# Patient Record
Sex: Male | Born: 1990
Health system: Southern US, Community
[De-identification: ages and names within clinical notes are randomized; demographics above are authoritative.]

## PROBLEM LIST (undated history)

## (undated) DIAGNOSIS — F32A Depression, unspecified: Secondary | ICD-10-CM

## (undated) DIAGNOSIS — F319 Bipolar disorder, unspecified: Secondary | ICD-10-CM

## (undated) DIAGNOSIS — F419 Anxiety disorder, unspecified: Secondary | ICD-10-CM

## (undated) DIAGNOSIS — F909 Attention-deficit hyperactivity disorder, unspecified type: Secondary | ICD-10-CM

## (undated) DIAGNOSIS — F329 Major depressive disorder, single episode, unspecified: Secondary | ICD-10-CM

## (undated) DIAGNOSIS — F1910 Other psychoactive substance abuse, uncomplicated: Secondary | ICD-10-CM

## (undated) HISTORY — DX: Depression, unspecified: F32.A

## (undated) HISTORY — DX: Anxiety disorder, unspecified: F41.9

## (undated) HISTORY — DX: Major depressive disorder, single episode, unspecified: F32.9

## (undated) HISTORY — DX: Attention-deficit hyperactivity disorder, unspecified type: F90.9

---
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
## 2012-12-24 ENCOUNTER — Emergency Department (HOSPITAL_BASED_OUTPATIENT_CLINIC_OR_DEPARTMENT_OTHER): Payer: BC Managed Care – PPO

## 2012-12-24 ENCOUNTER — Encounter (HOSPITAL_BASED_OUTPATIENT_CLINIC_OR_DEPARTMENT_OTHER): Payer: Self-pay | Admitting: Emergency Medicine

## 2012-12-24 ENCOUNTER — Emergency Department (HOSPITAL_BASED_OUTPATIENT_CLINIC_OR_DEPARTMENT_OTHER)
Admission: EM | Admit: 2012-12-24 | Discharge: 2012-12-24 | Disposition: A | Discharge status: Home / Self Care | Payer: BC Managed Care – PPO | Attending: Emergency Medicine | Admitting: Emergency Medicine

## 2012-12-24 DIAGNOSIS — F17200 Nicotine dependence, unspecified, uncomplicated: Secondary | ICD-10-CM | POA: Insufficient documentation

## 2012-12-24 DIAGNOSIS — R296 Repeated falls: Secondary | ICD-10-CM | POA: Insufficient documentation

## 2012-12-24 DIAGNOSIS — S20219A Contusion of unspecified front wall of thorax, initial encounter: Secondary | ICD-10-CM | POA: Insufficient documentation

## 2012-12-24 DIAGNOSIS — Y9239 Other specified sports and athletic area as the place of occurrence of the external cause: Secondary | ICD-10-CM | POA: Insufficient documentation

## 2012-12-24 DIAGNOSIS — S20229A Contusion of unspecified back wall of thorax, initial encounter: Secondary | ICD-10-CM

## 2012-12-24 DIAGNOSIS — S90122A Contusion of left lesser toe(s) without damage to nail, initial encounter: Secondary | ICD-10-CM

## 2012-12-24 DIAGNOSIS — S90129A Contusion of unspecified lesser toe(s) without damage to nail, initial encounter: Secondary | ICD-10-CM | POA: Insufficient documentation

## 2012-12-24 DIAGNOSIS — Z8659 Personal history of other mental and behavioral disorders: Secondary | ICD-10-CM | POA: Insufficient documentation

## 2012-12-24 DIAGNOSIS — Y9367 Activity, basketball: Secondary | ICD-10-CM | POA: Insufficient documentation

## 2012-12-24 DIAGNOSIS — IMO0002 Reserved for concepts with insufficient information to code with codable children: Secondary | ICD-10-CM | POA: Insufficient documentation

## 2012-12-24 HISTORY — DX: Bipolar disorder, unspecified: F31.9

## 2012-12-24 MED ORDER — IBUPROFEN 800 MG PO TABS: 800.0000 mg | ORAL_TABLET | Freq: Three times a day (TID) | ORAL | Status: DC

## 2012-12-24 NOTE — ED Notes (Signed)
Left great toe injury while playing basketball yesterday. Pain in his left foot and back.

## 2012-12-24 NOTE — ED Provider Notes (Signed)
CSN: [id number]     Arrival date & time 12/24/12  1730 History   First MD Initiated Contact with Patient 12/24/12 1801     Chief Complaint  Patient presents with  . Toe Injury   (Consider location/radiation/quality/duration/timing/severity/associated sxs/prior Treatment) Patient is a 22 y.o. male presenting with fall. The history is provided by the patient. No language interpreter was used.  Fall This is a new problem. The current episode started yesterday. The problem occurs constantly. The problem has been gradually worsening. Associated symptoms include joint swelling. Associated symptoms comments: Back pain. Nothing aggravates the symptoms. He has tried nothing for the symptoms. The treatment provided moderate relief.    Past Medical History  Diagnosis Date  . Bipolar 1 disorder    History reviewed. No pertinent past surgical history. No family history on file. History  Substance Use Topics  . Smoking status: Current Every Day Smoker -- 1.00 packs/day    Types: Cigarettes  . Smokeless tobacco: Not on file  . Alcohol Use: Yes    Review of Systems  Musculoskeletal: Positive for back pain and joint swelling.  All other systems reviewed and are negative.    Allergies  Review of patient's allergies indicates no known allergies.  Home Medications  No current outpatient prescriptions on file. BP 144/68  Pulse 91  Temp(Src) 98.9 F (37.2 C) (Oral)  Resp 20  Ht 5\' 10"  (1.778 m)  Wt 175 lb (79.379 kg)  BMI 25.11 kg/m2  SpO2 99% Physical Exam  Nursing note and vitals reviewed. Constitutional: He is oriented to person, place, and time. He appears well-developed.  Cardiovascular: Normal rate.   Pulmonary/Chest: Effort normal.  Musculoskeletal: He exhibits tenderness.  Swollen tender left 1st toe,  Tender mid thoracic spine  Neurological: He is alert and oriented to person, place, and time.  Skin: Skin is warm.  Psychiatric: He has a normal mood and affect.    ED  Course  Procedures (including critical care time) Labs Review Labs Reviewed - No data to display Imaging Review No results found.  EKG Interpretation   None       MDM   1. Contusion, toe, left, initial encounter   2. Contusion of back wall of thorax, unspecified laterality, initial encounter    Buddy tape,  Follow up with dr. Pearletha Forge for recheck    Elson Areas, PA-C 12/24/12 1942

## 2012-12-28 NOTE — ED Provider Notes (Signed)
Medical screening examination/treatment/procedure(s) were performed by non-physician practitioner and as supervising physician I was immediately available for consultation/collaboration.    Derran Sear R Kristal Perl, MD 12/28/12 1538 

## 2013-01-22 ENCOUNTER — Ambulatory Visit: Payer: BC Managed Care – PPO | Admitting: Family Medicine

## 2014-04-27 ENCOUNTER — Emergency Department (HOSPITAL_BASED_OUTPATIENT_CLINIC_OR_DEPARTMENT_OTHER)
Admission: EM | Admit: 2014-04-27 | Discharge: 2014-04-27 | Disposition: A | Discharge status: Home / Self Care | Payer: BLUE CROSS/BLUE SHIELD | Attending: Emergency Medicine | Admitting: Emergency Medicine

## 2014-04-27 ENCOUNTER — Encounter (HOSPITAL_BASED_OUTPATIENT_CLINIC_OR_DEPARTMENT_OTHER): Payer: Self-pay | Admitting: *Deleted

## 2014-04-27 DIAGNOSIS — K6289 Other specified diseases of anus and rectum: Secondary | ICD-10-CM | POA: Insufficient documentation

## 2014-04-27 DIAGNOSIS — Z720 Tobacco use: Secondary | ICD-10-CM | POA: Diagnosis not present

## 2014-04-27 DIAGNOSIS — R197 Diarrhea, unspecified: Secondary | ICD-10-CM | POA: Diagnosis not present

## 2014-04-27 DIAGNOSIS — Z8659 Personal history of other mental and behavioral disorders: Secondary | ICD-10-CM | POA: Diagnosis not present

## 2014-04-27 DIAGNOSIS — Z791 Long term (current) use of non-steroidal anti-inflammatories (NSAID): Secondary | ICD-10-CM | POA: Diagnosis not present

## 2014-04-27 DIAGNOSIS — R109 Unspecified abdominal pain: Secondary | ICD-10-CM | POA: Diagnosis present

## 2014-04-27 MED ORDER — HYDROCORTISONE 2.5 % RE CREA: TOPICAL_CREAM | RECTAL | Status: DC

## 2014-04-27 NOTE — Discharge Instructions (Signed)
Anal Fistula An anal fistula is an abnormal tunnel that develops between the bowel and skin near the outside of the anus, where feces comes out. The anus has a number of tiny glands that make lubricating fluid. Sometimes these glands can become plugged and infected. This may lead to the development of a fluid-filled pocket (abscess). An anal fistula often develops after this infection or abscess. It is nearly always caused by a past or current anal abscess.  CAUSES  Though an anal fistula is almost always caused by a past or current anal abscess, other causes can include:  A complication of surgery.  Trauma to the rectal area.  Radiation to the area.  Other medical conditions or diseases, such as:   Chronic inflammatory bowel disease, such as Crohn disease or ulcerative colitis.   Colon or rectal cancer.   Diverticular disease, such as diverticulitis.   A sexually transmitted disease, such as gonorrhea, chlamydia, or syphilis.  An HIV infection or AIDS.  SYMPTOMS   Throbbing or constant pain that may be worse when sitting.   Swelling or irritation around the anus.   Drainage of pus or blood from an opening near the anus.   Pain with bowel movements.  Fever or chills. DIAGNOSIS  Your caregiver will examine the area to find the openings of the anal fistula and the fistula tract. The external opening of the anal fistula may be seen during a physical examination. Other examinations that may be performed include:   Examination of the rectal area with a gloved hand (digital rectal exam).   Examination with a probe or scope to help locate the internal opening of the fistula.   Injection of a dye into the fistula opening. X-rays can be taken to find the exact location and path of the fistula.   An MRI or ultrasound of the anal area.  Other tests may be performed to find the cause of the anal fistula.   TREATMENT  The most common treatment for an anal fistula is  surgery. There are different surgery options depending on where your fistula is located and how complex the fistula is. Surgical options include:  A fistulotomy. This surgery involves opening up the whole fistula and draining the contents inside to promote healing.  Seton placement. A silk string (seton) is placed into the fistula during a fistulotomy to drain any infection to promote healing.  Advancement flap procedure. Tissue is removed from your rectum or the skin around the anus and is attached to the opening of the fistula.  Bioprosthetic plug. A cone-shaped plug is made from your tissue and is used to block the opening of the fistula. Some anal fistulas do not require surgery. A fibrin glue is a non-surgical option that involves injecting the glue to seal the fistula. You also may be prescribed an antibiotic medicine to treat an infection.  HOME CARE INSTRUCTIONS   Take your antibiotics as directed. Finish them even if you start to feel better.  Only take over-the-counter or prescription medicines as directed by your caregiver.Use a stool softener or laxative, if recommended.   Eat a high-fiber diet to help avoid constipation or as directed by your caregiver.  Drink enough water to keep your urine clear or pale yellow.   A warm sitz bath may be soothing and help with healing. You may take warm sitz baths for 15-20 minutes, 3-4 times a day to ease pain and discomfort.   Follow excellent hygiene to keep the anal area  as clean and dry as possible. Use wet toilet paper or moist towelettes after each bowel movement.  SEEK MEDICAL CARE IF: You have increased pain not controlled with medicines.  SEEK IMMEDIATE MEDICAL CARE IF:  You have severe, intolerable pain.  You have new swelling, redness, or discharge around the anal area.  You have tenderness or warmth around the anal area.  You have chills or diarrhea.  You have severe problems urinating or having a bowel movement.    You have a fever or persistent symptoms for more than 2-3 days.   You have a fever and your symptoms suddenly get worse.  MAKE SURE YOU:   Understand these instructions.  Will watch your condition.  Will get help right away if you are not doing well or get worse. Document Released: 02/01/2008 Document Revised: 02/05/2012 Document Reviewed: 12/24/2010 [facility] Patient Information 2015 [place], [place]. This information is not intended to replace advice given to you by your health care provider. Make sure you discuss any questions you have with your health care provider.  Diarrhea Diarrhea is frequent loose and watery bowel movements. It can cause you to feel weak and dehydrated. Dehydration can cause you to become tired and thirsty, have a dry mouth, and have decreased urination that often is dark yellow. Diarrhea is a sign of another problem, most often an infection that will not last long. In most cases, diarrhea typically lasts 2-3 days. However, it can last longer if it is a sign of something more serious. It is important to treat your diarrhea as directed by your caregiver to lessen or prevent future episodes of diarrhea. CAUSES  Some common causes include:  Gastrointestinal infections caused by viruses, bacteria, or parasites.  Food poisoning or food allergies.  Certain medicines, such as antibiotics, chemotherapy, and laxatives.  Artificial sweeteners and fructose.  Digestive disorders. HOME CARE INSTRUCTIONS  Ensure adequate fluid intake (hydration): Have 1 cup (8 oz) of fluid for each diarrhea episode. Avoid fluids that contain simple sugars or sports drinks, fruit juices, whole milk products, and sodas. Your urine should be clear or pale yellow if you are drinking enough fluids. Hydrate with an oral rehydration solution that you can purchase at pharmacies, retail stores, and online. You can prepare an oral rehydration solution at home by mixing the following ingredients  together:   - tsp table salt.   tsp baking soda.   tsp salt substitute containing potassium chloride.  1  tablespoons sugar.  1 L (34 oz) of water.  Certain foods and beverages may increase the speed at which food moves through the gastrointestinal (GI) tract. These foods and beverages should be avoided and include:  Caffeinated and alcoholic beverages.  High-fiber foods, such as raw fruits and vegetables, nuts, seeds, and whole grain breads and cereals.  Foods and beverages sweetened with sugar alcohols, such as xylitol, sorbitol, and mannitol.  Some foods may be well tolerated and may help thicken stool including:  Starchy foods, such as rice, toast, pasta, low-sugar cereal, oatmeal, grits, baked potatoes, crackers, and bagels.  Bananas.  Applesauce.  Add probiotic-rich foods to help increase healthy bacteria in the GI tract, such as yogurt and fermented milk products.  Wash your hands well after each diarrhea episode.  Only take over-the-counter or prescription medicines as directed by your caregiver.  Take a warm bath to relieve any burning or pain from frequent diarrhea episodes. SEEK IMMEDIATE MEDICAL CARE IF:   You are unable to keep fluids down.  You  have persistent vomiting.  You have blood in your stool, or your stools are black and tarry.  You do not urinate in 6-8 hours, or there is only a small amount of very dark urine.  You have abdominal pain that increases or localizes.  You have weakness, dizziness, confusion, or light-headedness.  You have a severe headache.  Your diarrhea gets worse or does not get better.  You have a fever or persistent symptoms for more than 2-3 days.  You have a fever and your symptoms suddenly get worse. MAKE SURE YOU:   Understand these instructions.  Will watch your condition.  Will get help right away if you are not doing well or get worse. Document Released: 02/08/2002 Document Revised: 07/05/2013 Document  Reviewed: 10/27/2011 [facility] Patient Information 2015 [place], [place]. This information is not intended to replace advice given to you by your health care provider. Make sure you discuss any questions you have with your health care provider.

## 2014-04-27 NOTE — ED Notes (Signed)
Patient states that when he "goes to take a crap" theres "burning in his stomach." N/D

## 2014-04-27 NOTE — ED Provider Notes (Signed)
CSN: [id number]     Arrival date & time 04/27/14  1131 History   First MD Initiated Contact with Patient 04/27/14 1220     Chief Complaint  Patient presents with  . Abdominal Pain     Patient is a 24 y.o. male presenting with abdominal pain. The history is provided by the patient. No language interpreter was used.  Abdominal Pain  Clayton Stafford presents for the evaluation of rectal pain. He reports that he had diarrhea since last night. Approximately 3-4 episodes of loose stools. He has pain in his rectum with bowel movements. He thinks or maybe a small amount of blood present. He denies any fevers, vomiting, abdominal pain. He does have some nausea. He denies any history of trauma or injury to the rectum. He has no dysuria. No history of prior abdominal surgeries or medical problems. Symptoms are mild and intermittent.  Past Medical History  Diagnosis Date  . Bipolar 1 disorder    History reviewed. No pertinent past surgical history. No family history on file. History  Substance Use Topics  . Smoking status: Current Every Day Smoker -- 1.00 packs/day    Types: Cigarettes  . Smokeless tobacco: Not on file  . Alcohol Use: Yes    Review of Systems  Gastrointestinal: Positive for abdominal pain.  All other systems reviewed and are negative.     Allergies  Review of patient's allergies indicates no known allergies.  Home Medications   Prior to Admission medications   Medication Sig Start Date End Date Taking? Authorizing Provider  hydrocortisone (ANUSOL-HC) 2.5 % rectal cream Apply rectally 2 times daily 04/27/14   Tilden FossaElizabeth Keryn Nessler, MD  ibuprofen (ADVIL,MOTRIN) 800 MG tablet Take 1 tablet (800 mg total) by mouth 3 (three) times daily. 12/24/12   Lonia SkinnerLeslie K Sofia, PA-C   BP 128/54 mmHg  Pulse 68  Temp(Src) 97.8 F (36.6 C) (Oral)  Resp 16  SpO2 97% Physical Exam  Constitutional: He is oriented to person, place, and time. He appears well-developed and well-nourished.  HENT:   Head: Normocephalic and atraumatic.  Cardiovascular: Normal rate and regular rhythm.   No murmur heard. Pulmonary/Chest: Effort normal and breath sounds normal. No respiratory distress.  Abdominal: Soft. There is no tenderness. There is no rebound and no guarding.  Genitourinary:  Patient refuses rectal exam  Musculoskeletal: He exhibits no edema or tenderness.  Neurological: He is alert and oriented to person, place, and time.  Skin: Skin is warm and dry.  Psychiatric: He has a normal mood and affect. His behavior is normal.  Nursing note and vitals reviewed.   ED Course  Procedures (including critical care time) Labs Review Labs Reviewed - No data to display  Imaging Review No results found.   EKG Interpretation None      MDM   Final diagnoses:  Rectal pain  Diarrhea    Patient here for evaluation of rectal pain and some diarrhea. Patient is hemodynamically stable and nontoxic appearing on exam. History and presentation is not consistent with diverticulitis, appendicitis. Discussed with patient's recommendation for rectal exam to evaluate for fissures or hemorrhoids or evidence of infection. Patient refuses rectal exam. Discussed with patient that this is not a thorough evaluation in diagnoses can be missed. Patient understands risks and requests a local cream be prescribed. Providing prescription for Anusol cream with close PCP follow-up and return precautions were discussed.    Tilden FossaElizabeth Toma Erichsen, MD 04/27/14 1326

## 2014-05-15 ENCOUNTER — Encounter (HOSPITAL_BASED_OUTPATIENT_CLINIC_OR_DEPARTMENT_OTHER): Payer: Self-pay | Admitting: *Deleted

## 2014-05-15 ENCOUNTER — Emergency Department (HOSPITAL_BASED_OUTPATIENT_CLINIC_OR_DEPARTMENT_OTHER)
Admission: EM | Admit: 2014-05-15 | Discharge: 2014-05-15 | Discharge status: Against Medical Advice | Payer: BLUE CROSS/BLUE SHIELD | Attending: Emergency Medicine | Admitting: Emergency Medicine

## 2014-05-15 DIAGNOSIS — Z7952 Long term (current) use of systemic steroids: Secondary | ICD-10-CM | POA: Diagnosis not present

## 2014-05-15 DIAGNOSIS — Z8659 Personal history of other mental and behavioral disorders: Secondary | ICD-10-CM | POA: Diagnosis not present

## 2014-05-15 DIAGNOSIS — Z791 Long term (current) use of non-steroidal anti-inflammatories (NSAID): Secondary | ICD-10-CM | POA: Insufficient documentation

## 2014-05-15 DIAGNOSIS — Z720 Tobacco use: Secondary | ICD-10-CM | POA: Diagnosis not present

## 2014-05-15 DIAGNOSIS — R197 Diarrhea, unspecified: Secondary | ICD-10-CM

## 2014-05-15 DIAGNOSIS — R112 Nausea with vomiting, unspecified: Secondary | ICD-10-CM | POA: Insufficient documentation

## 2014-05-15 DIAGNOSIS — R1084 Generalized abdominal pain: Secondary | ICD-10-CM | POA: Diagnosis not present

## 2014-05-15 MED ORDER — ONDANSETRON 8 MG PO TBDP
8.0000 mg | ORAL_TABLET | Freq: Once | ORAL | Status: AC
  Administered 2014-05-15: 8 mg via ORAL
  Filled 2014-05-15: qty 1

## 2014-05-15 MED ORDER — KETOROLAC TROMETHAMINE 30 MG/ML IJ SOLN: 30.0000 mg | Freq: Once | INTRAMUSCULAR | Status: DC

## 2014-05-15 MED ORDER — SODIUM CHLORIDE 0.9 % IV BOLUS (SEPSIS)
1000.0000 mL | Freq: Once | INTRAVENOUS | Status: DC
Start: 2014-05-15 — End: 2014-05-15

## 2014-05-15 MED ORDER — ONDANSETRON HCL 4 MG/2ML IJ SOLN: 4.0000 mg | Freq: Once | INTRAMUSCULAR | Status: DC

## 2014-05-15 NOTE — ED Notes (Signed)
Pt refused all blood work tests and IV administration of any medications.  This RN explained reasons for testing and potential complications, and patient verbally acknowledged risks of refusing medical testing and treatment.  MD and PA-C advised.

## 2014-05-15 NOTE — ED Notes (Signed)
Pt reports vomited 10-12 times yesterday- states abd pain today after attempting to eat breakfast- states still feels weak

## 2014-05-16 NOTE — ED Provider Notes (Signed)
CSN: [id number]     Arrival date & time 05/15/14  1437 History   First MD Initiated Contact with Patient 05/15/14 1521     Chief Complaint  Patient presents with  . Emesis     (Consider location/radiation/quality/duration/timing/severity/associated sxs/prior Treatment) HPI Clayton Stafford is a 24 y.o. male with history of bipolar disorder presents to emergency department complaining of nausea, vomiting, abdominal pain, diarrhea. Patient states he vomited over 20 times yesterday. States no emesis today. She states he did have a diarrhea today, still having abdominal pain, but states it is improving as well. He did not try any medications prior to coming in. He states he feels weak and dehydrated. He denies any fever or chills. No blood in his emesis or use stools. He denies any recent travel, no recent ill contacts. No recent antibiotic use. Nothing is making his symptoms better or worse.  Past Medical History  Diagnosis Date  . Bipolar 1 disorder    History reviewed. No pertinent past surgical history. No family history on file. History  Substance Use Topics  . Smoking status: Current Every Day Smoker -- 1.00 packs/day    Types: Cigarettes  . Smokeless tobacco: Never Used  . Alcohol Use: 1.2 oz/week    2 Cans of beer per week     Comment: weekends    Review of Systems  Constitutional: Negative for fever and chills.  Respiratory: Negative for cough, chest tightness and shortness of breath.   Cardiovascular: Negative for chest pain, palpitations and leg swelling.  Gastrointestinal: Positive for nausea, vomiting, abdominal pain and diarrhea. Negative for abdominal distention.  Genitourinary: Negative for dysuria, urgency, frequency and hematuria.  Musculoskeletal: Negative for myalgias, arthralgias, neck pain and neck stiffness.  Skin: Negative for rash.  Allergic/Immunologic: Negative for immunocompromised state.  Neurological: Negative for dizziness, weakness,  light-headedness, numbness and headaches.  All other systems reviewed and are negative.     Allergies  Review of patient's allergies indicates no known allergies.  Home Medications   Prior to Admission medications   Medication Sig Start Date End Date Taking? Authorizing Provider  hydrocortisone (ANUSOL-HC) 2.5 % rectal cream Apply rectally 2 times daily 04/27/14   Clayton FossaElizabeth Rees, MD  ibuprofen (ADVIL,MOTRIN) 800 MG tablet Take 1 tablet (800 mg total) by mouth 3 (three) times daily. 12/24/12   Clayton AreasLeslie K Sofia, PA-C   BP 121/69 mmHg  Pulse 82  Temp(Src) 98.3 F (36.8 C) (Oral)  Resp 18  SpO2 100% Physical Exam  Constitutional: He is oriented to person, place, and time. He appears well-developed and well-nourished. No distress.  HENT:  Head: Normocephalic and atraumatic.  Mouth/Throat: Oropharynx is clear and moist.  Eyes: Conjunctivae are normal.  Neck: Neck supple.  Cardiovascular: Normal rate, regular rhythm and normal heart sounds.   Pulmonary/Chest: Effort normal. No respiratory distress. He has no wheezes. He has no rales.  Abdominal: Soft. Bowel sounds are normal. He exhibits no distension. There is tenderness. There is no rebound.  Diffuse tenderness  Musculoskeletal: He exhibits no edema.  Neurological: He is alert and oriented to person, place, and time.  Skin: Skin is warm and dry.  Nursing note and vitals reviewed.   ED Course  Procedures (including critical care time) Labs Review Labs Reviewed - No data to display  Imaging Review No results found.   EKG Interpretation None      MDM   Final diagnoses:  Nausea vomiting and diarrhea   patient is here with nausea, vomiting, diarrhea, abdominal  pain. All symptoms are improving. His abdomen is benign. I did order blood work on him and some medications for his symptoms, however patient refused. He will be discharged home AMA. Patient admitted all he needed was a work note. I did give him Zofran 8 mg ODT prior  to him leaving.  Filed Vitals:   05/15/14 1451  BP: 121/69  Pulse: 82  Temp: 98.3 F (36.8 C)  TempSrc: Oral  Resp: 18  SpO2: 100%     Clayton Crumble, PA-C 05/16/14 0115  Clayton Scott, MD 05/17/14 [phone]

## 2014-05-27 ENCOUNTER — Encounter (HOSPITAL_BASED_OUTPATIENT_CLINIC_OR_DEPARTMENT_OTHER): Payer: Self-pay | Admitting: *Deleted

## 2014-05-27 ENCOUNTER — Emergency Department (HOSPITAL_BASED_OUTPATIENT_CLINIC_OR_DEPARTMENT_OTHER)
Admission: EM | Admit: 2014-05-27 | Discharge: 2014-05-27 | Disposition: A | Discharge status: Home / Self Care | Payer: BLUE CROSS/BLUE SHIELD | Attending: Emergency Medicine | Admitting: Emergency Medicine

## 2014-05-27 DIAGNOSIS — Z7952 Long term (current) use of systemic steroids: Secondary | ICD-10-CM | POA: Insufficient documentation

## 2014-05-27 DIAGNOSIS — Z8659 Personal history of other mental and behavioral disorders: Secondary | ICD-10-CM | POA: Insufficient documentation

## 2014-05-27 DIAGNOSIS — K2970 Gastritis, unspecified, without bleeding: Secondary | ICD-10-CM | POA: Insufficient documentation

## 2014-05-27 DIAGNOSIS — Z720 Tobacco use: Secondary | ICD-10-CM | POA: Diagnosis not present

## 2014-05-27 DIAGNOSIS — Z791 Long term (current) use of non-steroidal anti-inflammatories (NSAID): Secondary | ICD-10-CM | POA: Diagnosis not present

## 2014-05-27 DIAGNOSIS — R112 Nausea with vomiting, unspecified: Secondary | ICD-10-CM | POA: Diagnosis present

## 2014-05-27 MED ORDER — SODIUM CHLORIDE 0.9 % IV BOLUS (SEPSIS)
1000.0000 mL | Freq: Once | INTRAVENOUS | Status: AC
  Administered 2014-05-27: 1000 mL via INTRAVENOUS

## 2014-05-27 MED ORDER — ONDANSETRON HCL 4 MG/2ML IJ SOLN
4.0000 mg | Freq: Once | INTRAMUSCULAR | Status: AC
  Administered 2014-05-27: 4 mg via INTRAVENOUS
  Filled 2014-05-27: qty 2

## 2014-05-27 NOTE — ED Provider Notes (Signed)
CSN: [id number]     Arrival date & time 05/27/14  1023 History   First MD Initiated Contact with Patient 05/27/14 1036     Chief Complaint  Patient presents with  . Emesis     (Consider location/radiation/quality/duration/timing/severity/associated sxs/prior Treatment) HPI Comments: Patient presents with nausea vomiting. He states that he drank a lot of alcohol last night and woke up this morning with nausea and vomiting. He states with the last couple episodes of vomiting, he had a few specks of blood in it. He has no ongoing bleeding. He says his abdomen feels sore from the vomiting but he denies any abdominal pain. He denies any diarrhea. He states he recently stop using heroin about 4-5 days ago. He denies any other withdrawal type symptoms. There is no myalgias. He denies any fevers.  Patient is a 24 y.o. male presenting with vomiting.  Emesis Associated symptoms: no abdominal pain, no arthralgias, no chills, no diarrhea and no headaches     Past Medical History  Diagnosis Date  . Bipolar 1 disorder    History reviewed. No pertinent past surgical history. No family history on file. History  Substance Use Topics  . Smoking status: Current Every Day Smoker -- 1.00 packs/day    Types: Cigarettes  . Smokeless tobacco: Never Used  . Alcohol Use: 1.2 oz/week    2 Cans of beer per week     Comment: weekends    Review of Systems  Constitutional: Negative for fever, chills, diaphoresis and fatigue.  HENT: Negative for congestion, rhinorrhea and sneezing.   Eyes: Negative.   Respiratory: Negative for cough, chest tightness and shortness of breath.   Cardiovascular: Negative for chest pain and leg swelling.  Gastrointestinal: Positive for nausea and vomiting. Negative for abdominal pain, diarrhea and blood in stool.  Genitourinary: Negative for frequency, hematuria, flank pain and difficulty urinating.  Musculoskeletal: Negative for back pain and arthralgias.  Skin: Negative for  rash.  Neurological: Negative for dizziness, speech difficulty, weakness, numbness and headaches.      Allergies  Review of patient's allergies indicates no known allergies.  Home Medications   Prior to Admission medications   Medication Sig Start Date End Date Taking? Authorizing Provider  hydrocortisone (ANUSOL-HC) 2.5 % rectal cream Apply rectally 2 times daily 04/27/14   Tilden Fossa, MD  ibuprofen (ADVIL,MOTRIN) 800 MG tablet Take 1 tablet (800 mg total) by mouth 3 (three) times daily. 12/24/12   Lonia Skinner Sofia, PA-C   BP 100/44 mmHg  Pulse 68  Temp(Src) 98 F (36.7 C) (Oral)  Resp 18  SpO2 98% Physical Exam  Constitutional: He is oriented to person, place, and time. He appears well-developed and well-nourished.  HENT:  Head: Normocephalic and atraumatic.  Eyes: Pupils are equal, round, and reactive to light.  Neck: Normal range of motion. Neck supple.  Cardiovascular: Normal rate, regular rhythm and normal heart sounds.   Pulmonary/Chest: Effort normal and breath sounds normal. No respiratory distress. He has no wheezes. He has no rales. He exhibits no tenderness.  Abdominal: Soft. Bowel sounds are normal. There is no tenderness. There is no rebound and no guarding.  Musculoskeletal: Normal range of motion. He exhibits no edema.  Lymphadenopathy:    He has no cervical adenopathy.  Neurological: He is alert and oriented to person, place, and time.  Skin: Skin is warm and dry. No rash noted.  Psychiatric: He has a normal mood and affect.    ED Course  Procedures (including critical care time)  Labs Review Labs Reviewed - No data to display  Imaging Review No results found.   EKG Interpretation None      MDM   Final diagnoses:  Gastritis    Patient presents with vomiting after drinking a large amount alcohol. His abdominal exam is completely benign. He was given a liter IV fluids and some Zofran in the ED. And currently he is pacing the room ready to  leave. He was discharged home and given return precautions.    Rolan BuccoMelanie Adlai Nieblas, MD 05/27/14 [phone]

## 2014-05-27 NOTE — ED Notes (Signed)
Pt also reports he "drank a lot last night" and stopped doing heroin 4 days ago

## 2014-05-27 NOTE — ED Notes (Signed)
Pt states "I feel like I have hangover.Marland Kitchen.Marland Kitchen."

## 2014-05-27 NOTE — ED Notes (Signed)
Reports n/v since 0900 this morning; vomited x 6

## 2014-05-27 NOTE — Discharge Instructions (Signed)

## 2014-07-19 ENCOUNTER — Encounter (HOSPITAL_COMMUNITY): Payer: Self-pay | Admitting: *Deleted

## 2014-07-19 ENCOUNTER — Emergency Department (HOSPITAL_COMMUNITY)
Admission: EM | Admit: 2014-07-19 | Discharge: 2014-07-19 | Disposition: A | Discharge status: Home / Self Care | Payer: BLUE CROSS/BLUE SHIELD | Attending: Emergency Medicine | Admitting: Emergency Medicine

## 2014-07-19 DIAGNOSIS — Z791 Long term (current) use of non-steroidal anti-inflammatories (NSAID): Secondary | ICD-10-CM | POA: Insufficient documentation

## 2014-07-19 DIAGNOSIS — Z720 Tobacco use: Secondary | ICD-10-CM | POA: Diagnosis not present

## 2014-07-19 DIAGNOSIS — F10120 Alcohol abuse with intoxication, uncomplicated: Secondary | ICD-10-CM | POA: Insufficient documentation

## 2014-07-19 DIAGNOSIS — Z7952 Long term (current) use of systemic steroids: Secondary | ICD-10-CM | POA: Insufficient documentation

## 2014-07-19 DIAGNOSIS — F10129 Alcohol abuse with intoxication, unspecified: Secondary | ICD-10-CM | POA: Diagnosis present

## 2014-07-19 DIAGNOSIS — F10920 Alcohol use, unspecified with intoxication, uncomplicated: Secondary | ICD-10-CM

## 2014-07-19 LAB — CBC WITH DIFFERENTIAL/PLATELET
Basophils Absolute: 0.1 10*3/uL (ref 0.0–0.1)
Basophils Relative: 1 % (ref 0–1)
EOS ABS: 0.2 10*3/uL (ref 0.0–0.7)
EOS PCT: 3 % (ref 0–5)
HCT: 43.7 % (ref 39.0–52.0)
HEMOGLOBIN: 14.8 g/dL (ref 13.0–17.0)
LYMPHS ABS: 3.2 10*3/uL (ref 0.7–4.0)
Lymphocytes Relative: 42 % (ref 12–46)
MCH: 29.5 pg (ref 26.0–34.0)
MCHC: 33.9 g/dL (ref 30.0–36.0)
MCV: 87.2 fL (ref 78.0–100.0)
MONOS PCT: 7 % (ref 3–12)
Monocytes Absolute: 0.5 10*3/uL (ref 0.1–1.0)
Neutro Abs: 3.6 10*3/uL (ref 1.7–7.7)
Neutrophils Relative %: 47 % (ref 43–77)
Platelets: 321 10*3/uL (ref 150–400)
RBC: 5.01 MIL/uL (ref 4.22–5.81)
RDW: 12.6 % (ref 11.5–15.5)
WBC: 7.5 10*3/uL (ref 4.0–10.5)

## 2014-07-19 LAB — BASIC METABOLIC PANEL
ANION GAP: 13 (ref 5–15)
BUN: 8 mg/dL (ref 6–20)
CALCIUM: 8.9 mg/dL (ref 8.9–10.3)
CHLORIDE: 103 mmol/L (ref 101–111)
CO2: 25 mmol/L (ref 22–32)
Creatinine, Ser: 0.92 mg/dL (ref 0.61–1.24)
GFR calc Af Amer: >60 mL/min (ref >60)
GFR calc non Af Amer: >60 mL/min (ref >60)
Glucose, Bld: 94 mg/dL (ref 65–99)
POTASSIUM: 2.9 mmol/L — AB (ref 3.5–5.1)
SODIUM: 141 mmol/L (ref 135–145)

## 2014-07-19 LAB — ETHANOL: ALCOHOL ETHYL (B): 162 mg/dL — AB (ref <5)

## 2014-07-19 NOTE — ED Notes (Signed)
Patient is alert and oriented x3.  He was given DC instructions and follow up visit instructions.  Patient gave verbal understanding.  He was DC ambulatory under his own power to home.  V/S stable.  He was not showing any signs of distress on DC 
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
## 2014-07-19 NOTE — Discharge Instructions (Signed)
Behavioral Health Resources in the Community ° °Intensive Outpatient Programs: °[facility]      °[address] °[place], [place] °[phone] °Both a day and evening program °      °[facility]     °[address]        °[place], [place] [address] °[phone]        ° °[facility] °[address] °[place] °[phone] ° °[facility] °ACCESS LINE: 1-800-853-5163 or 336-641-4981 °[address] °[place], [place] [address] °[email or web address].[email or web address]/adult.htm ° °Mobile Crisis Teams:         °                               °[facility]         °Mobile Crisis Care Unit °1-877-626-1772       °      °Assertive °[facility] °[address] [place] °[phone] °                                        °[facility] °Sharon DeEsch °[address] °[place] °336-554-5454 ° °Self-Help/Support Groups: °[facility] Variety of support groups °373-1402 (call for more info) ° °Narcotics Anonymous (NA) °[facility] °[address] °[place] - 2 meetings at this location ° °Residential Treatment Programs:  °[facility]      °[address]        °[place]       °866-801-8205        ° °[facility] °[address] °[place], [place]  [address] °[phone] ° °[facility]  °[address] °[place], [place] [address] °[phone] °Admissions: 8am-3pm M-F ° °[facility]     °[address]        °[place], [place] [address]       °[phone]        ° °[facility] °[address] °[place], [place] °[phone] ° °[facility] °[address] °[place], [place] °336-285-9073 ° °[facility] Intensive [facility]      °[address]     °[place], [place]       °[phone]        ° °[facility]        °[address] °[place], [place] °877-615-2722 or 336-784-9470 ° °[facility]  °[address] °[place], [place] °[phone] ° °[facility]                                               °[address] °[place]  °800-659-3381 ° °[facility]: °[facility]- 1-888-581-9988              ° °General Therapy                                                °Julie Brannon, PhD        °  [address]                                       [place], [place] [address]         [phone]   Insurance  [facility]   [address] [place], [place] [address] [phone]  [facility] [address] [place], [place] [address] [phone] Insurance/Medicaid/sponsorship through [facility]                                              [address]                                        [place], [place] [address]    Therapy/tele-psych/case         [phone]          [facility] [address]   [place], [place]  [address]  Adolescent/group home/case management [phone]                                           [place]ola CornJulia Brannon PhD       General therapy       Insurance   [phone]         Dr. Lolly MustacheArfeen Insurance [phone]- [phone] M-F  [facility] Medicaid, sponsorship [phone]   Alcohol Intoxication Alcohol intoxication occurs when you drink enough alcohol that it affects your ability to function. It can be mild or very severe. Drinking a lot of alcohol in a short time is called binge drinking. This can be very harmful. Drinking alcohol can also be more dangerous if you are taking medicines or other drugs. Some of the effects caused by alcohol may include:  Loss of coordination.  Changes in mood and behavior.  Unclear thinking.  Trouble talking (slurred speech).  Throwing up (vomiting).  Confusion.  Slowed breathing.  Twitching and shaking (seizures).  Loss  of consciousness. HOME CARE  Do not drive after drinking alcohol.  Drink enough water and fluids to keep your pee (urine) clear or pale yellow. Avoid caffeine.  Only take medicine as told by your doctor. GET HELP IF:  You throw up (vomit) many times.  You do not feel better after a few days.  You frequently have alcohol intoxication. Your doctor can help decide if you should see a substance use treatment counselor. GET HELP RIGHT AWAY IF:  You become shaky when you stop drinking.  You have twitching and shaking.  You throw up blood. It may look bright red or like coffee grounds.  You notice blood in your poop (bowel movements).  You become lightheaded or pass out (faint). MAKE SURE YOU:   Understand these instructions.  Will watch your condition.  Will get help right away if you are not doing well or get worse. Document Released: 08/07/2007 Document Revised: 10/21/2012 Document Reviewed: 07/24/2012 [facility] Patient Information 2015 [place], [place]. This information is not intended to replace advice given to you by your health care provider. Make sure you discuss any questions you have with  your health care provider.  How Much is Too Much Alcohol? Drinking too much alcohol can cause injury, accidents, and health problems. These types of problems can include:   Car crashes.  Falls.  Family fighting (domestic violence).  Drowning.  Fights.  Injuries.  Burns.  Damage to certain organs.  Having a baby with birth defects. ONE DRINK CAN BE TOO MUCH WHEN YOU ARE:  Working.  Pregnant or breastfeeding.  Taking medicines. Ask your doctor.  Driving or planning to drive. WHAT IS A STANDARD DRINK?   1 regular beer (12 ounces or 360 milliliters).  1 glass of wine (5 ounces or 150 milliliters).  1 shot of liquor (1.5 ounces or 45 milliliters). BLOOD ALCOHOL LEVELS   .00 A person is sober.  Marland Kitchen.03 A person has no trouble keeping balance, talking, or seeing  right, but a "buzz" may be felt.  Marland Kitchen.05 A person feels "buzzed" and relaxed.  Marland Kitchen.08 or .10  A person is drunk. He or she has trouble talking, seeing right, and keeping his or her balance.  .15 A person loses body control and may pass out (blackout).  .20 A person has trouble walking (staggering) and throws up (vomits).  .30 A person will pass out (unconscious).  .40+ A person will be in a coma. Death is possible. If you or someone you know has a drinking problem, get help from a doctor.  Document Released: 12/15/2008 Document Revised: 05/13/2011 Document Reviewed: 12/15/2008 [facility] Patient Information 2015 [place], [place]. This information is not intended to replace advice given to you by your health care provider. Make sure you discuss any questions you have with your health care provider.
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
## 2014-09-05 ENCOUNTER — Ambulatory Visit (HOSPITAL_COMMUNITY)
Admission: RE | Admit: 2014-09-05 | Discharge: 2014-09-05 | Disposition: A | Discharge status: Home / Self Care | Payer: BLUE CROSS/BLUE SHIELD | Source: Home / Self Care | Attending: Psychiatry | Admitting: Psychiatry

## 2014-09-05 ENCOUNTER — Encounter (HOSPITAL_COMMUNITY): Payer: Self-pay | Admitting: *Deleted

## 2014-09-05 ENCOUNTER — Encounter (HOSPITAL_COMMUNITY): Payer: Self-pay

## 2014-09-05 ENCOUNTER — Inpatient Hospital Stay (HOSPITAL_COMMUNITY)
Admission: EM | Admit: 2014-09-05 | Discharge: 2014-09-07 | DRG: 897 | Disposition: A | Discharge status: Home / Self Care | Payer: BLUE CROSS/BLUE SHIELD | Source: Intra-hospital | Attending: Psychiatry | Admitting: Psychiatry

## 2014-09-05 ENCOUNTER — Encounter (HOSPITAL_COMMUNITY): Payer: Self-pay | Admitting: Emergency Medicine

## 2014-09-05 ENCOUNTER — Emergency Department (HOSPITAL_COMMUNITY)
Admission: EM | Admit: 2014-09-05 | Discharge: 2014-09-05 | Disposition: A | Discharge status: Other Acute Inpatient Hospital | Payer: BLUE CROSS/BLUE SHIELD | Attending: Emergency Medicine | Admitting: Emergency Medicine

## 2014-09-05 DIAGNOSIS — F1994 Other psychoactive substance use, unspecified with psychoactive substance-induced mood disorder: Secondary | ICD-10-CM | POA: Diagnosis not present

## 2014-09-05 DIAGNOSIS — F17210 Nicotine dependence, cigarettes, uncomplicated: Secondary | ICD-10-CM | POA: Diagnosis present

## 2014-09-05 DIAGNOSIS — F319 Bipolar disorder, unspecified: Secondary | ICD-10-CM | POA: Diagnosis present

## 2014-09-05 DIAGNOSIS — F419 Anxiety disorder, unspecified: Secondary | ICD-10-CM | POA: Diagnosis present

## 2014-09-05 DIAGNOSIS — Z7952 Long term (current) use of systemic steroids: Secondary | ICD-10-CM | POA: Insufficient documentation

## 2014-09-05 DIAGNOSIS — Z720 Tobacco use: Secondary | ICD-10-CM | POA: Diagnosis not present

## 2014-09-05 DIAGNOSIS — F1210 Cannabis abuse, uncomplicated: Secondary | ICD-10-CM | POA: Diagnosis not present

## 2014-09-05 DIAGNOSIS — R45851 Suicidal ideations: Secondary | ICD-10-CM

## 2014-09-05 DIAGNOSIS — F1924 Other psychoactive substance dependence with psychoactive substance-induced mood disorder: Principal | ICD-10-CM | POA: Diagnosis present

## 2014-09-05 DIAGNOSIS — F1910 Other psychoactive substance abuse, uncomplicated: Secondary | ICD-10-CM

## 2014-09-05 DIAGNOSIS — Z791 Long term (current) use of non-steroidal anti-inflammatories (NSAID): Secondary | ICD-10-CM | POA: Insufficient documentation

## 2014-09-05 DIAGNOSIS — F1920 Other psychoactive substance dependence, uncomplicated: Secondary | ICD-10-CM | POA: Diagnosis present

## 2014-09-05 DIAGNOSIS — G4700 Insomnia, unspecified: Secondary | ICD-10-CM | POA: Diagnosis present

## 2014-09-05 DIAGNOSIS — F4310 Post-traumatic stress disorder, unspecified: Secondary | ICD-10-CM | POA: Diagnosis present

## 2014-09-05 DIAGNOSIS — F1410 Cocaine abuse, uncomplicated: Secondary | ICD-10-CM | POA: Diagnosis not present

## 2014-09-05 LAB — COMPREHENSIVE METABOLIC PANEL
ALT: 12 U/L — AB (ref 17–63)
AST: 14 U/L — ABNORMAL LOW (ref 15–41)
Albumin: 5 g/dL (ref 3.5–5.0)
Alkaline Phosphatase: 88 U/L (ref 38–126)
Anion gap: 10 (ref 5–15)
BUN: 8 mg/dL (ref 6–20)
CO2: 28 mmol/L (ref 22–32)
CREATININE: 0.89 mg/dL (ref 0.61–1.24)
Calcium: 9.6 mg/dL (ref 8.9–10.3)
Chloride: 101 mmol/L (ref 101–111)
GFR calc non Af Amer: >60 mL/min (ref >60)
Glucose, Bld: 88 mg/dL (ref 65–99)
Potassium: 3.7 mmol/L (ref 3.5–5.1)
Sodium: 139 mmol/L (ref 135–145)
Total Bilirubin: 0.8 mg/dL (ref 0.3–1.2)
Total Protein: 8.4 g/dL — ABNORMAL HIGH (ref 6.5–8.1)

## 2014-09-05 LAB — CBC
HEMATOCRIT: 46.2 % (ref 39.0–52.0)
Hemoglobin: 15.8 g/dL (ref 13.0–17.0)
MCH: 30.3 pg (ref 26.0–34.0)
MCHC: 34.2 g/dL (ref 30.0–36.0)
MCV: 88.5 fL (ref 78.0–100.0)
Platelets: 304 10*3/uL (ref 150–400)
RBC: 5.22 MIL/uL (ref 4.22–5.81)
RDW: 13.3 % (ref 11.5–15.5)
WBC: 9.2 10*3/uL (ref 4.0–10.5)

## 2014-09-05 LAB — ACETAMINOPHEN LEVEL: Acetaminophen (Tylenol), Serum: <10 ug/mL — ABNORMAL LOW (ref 10–30)

## 2014-09-05 LAB — RAPID URINE DRUG SCREEN, HOSP PERFORMED
Amphetamines: NOT DETECTED
Barbiturates: NOT DETECTED
Benzodiazepines: NOT DETECTED
COCAINE: POSITIVE — AB
Opiates: NOT DETECTED
Tetrahydrocannabinol: POSITIVE — AB

## 2014-09-05 LAB — ETHANOL: ALCOHOL ETHYL (B): 13 mg/dL — AB (ref <5)

## 2014-09-05 LAB — SALICYLATE LEVEL: Salicylate Lvl: <4 mg/dL (ref 2.8–30.0)

## 2014-09-05 MED ORDER — CHLORDIAZEPOXIDE HCL 25 MG PO CAPS
25.0000 mg | ORAL_CAPSULE | Freq: Four times a day (QID) | ORAL | Status: AC
  Administered 2014-09-05 (×4): 25 mg via ORAL
  Filled 2014-09-05 (×4): qty 1

## 2014-09-05 MED ORDER — ENSURE ENLIVE PO LIQD
237.0000 mL | Freq: Two times a day (BID) | ORAL | Status: DC
  Administered 2014-09-06 – 2014-09-07 (×2): 237 mL via ORAL

## 2014-09-05 MED ORDER — HYDROXYZINE HCL 25 MG PO TABS: 25.0000 mg | ORAL_TABLET | Freq: Four times a day (QID) | ORAL | Status: DC | PRN

## 2014-09-05 MED ORDER — CHLORDIAZEPOXIDE HCL 25 MG PO CAPS
25.0000 mg | ORAL_CAPSULE | ORAL | Status: DC
  Administered 2014-09-07: 25 mg via ORAL
  Filled 2014-09-05: qty 1

## 2014-09-05 MED ORDER — NICOTINE 21 MG/24HR TD PT24
21.0000 mg | MEDICATED_PATCH | Freq: Every day | TRANSDERMAL | Status: DC
  Administered 2014-09-06 – 2014-09-07 (×2): 21 mg via TRANSDERMAL
  Filled 2014-09-05 (×4): qty 1
  Filled 2014-09-05: qty 14
  Filled 2014-09-05: qty 1

## 2014-09-05 MED ORDER — CHLORDIAZEPOXIDE HCL 25 MG PO CAPS: 25.0000 mg | ORAL_CAPSULE | Freq: Every day | ORAL | Status: DC

## 2014-09-05 MED ORDER — MAGNESIUM HYDROXIDE 400 MG/5ML PO SUSP: 30.0000 mL | Freq: Every day | ORAL | Status: DC | PRN

## 2014-09-05 MED ORDER — ONDANSETRON 4 MG PO TBDP: 4.0000 mg | ORAL_TABLET | Freq: Four times a day (QID) | ORAL | Status: DC | PRN

## 2014-09-05 MED ORDER — CHLORDIAZEPOXIDE HCL 25 MG PO CAPS
25.0000 mg | ORAL_CAPSULE | Freq: Three times a day (TID) | ORAL | Status: AC
  Administered 2014-09-06 (×3): 25 mg via ORAL
  Filled 2014-09-05 (×3): qty 1

## 2014-09-05 MED ORDER — ALUM & MAG HYDROXIDE-SIMETH 200-200-20 MG/5ML PO SUSP
30.0000 mL | ORAL | Status: DC | PRN
Start: 2014-09-05 — End: 2014-09-07

## 2014-09-05 MED ORDER — ACETAMINOPHEN 325 MG PO TABS: 650.0000 mg | ORAL_TABLET | Freq: Four times a day (QID) | ORAL | Status: DC | PRN

## 2014-09-05 MED ORDER — CHLORDIAZEPOXIDE HCL 25 MG PO CAPS: 25.0000 mg | ORAL_CAPSULE | Freq: Four times a day (QID) | ORAL | Status: DC | PRN

## 2014-09-05 MED ORDER — THIAMINE HCL 100 MG/ML IJ SOLN
100.0000 mg | Freq: Once | INTRAMUSCULAR | Status: DC
  Filled 2014-09-05: qty 2

## 2014-09-05 MED ORDER — TRAZODONE HCL 50 MG PO TABS
50.0000 mg | ORAL_TABLET | Freq: Every evening | ORAL | Status: DC | PRN
  Administered 2014-09-05 – 2014-09-06 (×2): 50 mg via ORAL
  Filled 2014-09-05 (×2): qty 1
  Filled 2014-09-05 (×2): qty 28
  Filled 2014-09-05 (×4): qty 1

## 2014-09-05 MED ORDER — ADULT MULTIVITAMIN W/MINERALS CH
1.0000 | ORAL_TABLET | Freq: Every day | ORAL | Status: DC
  Administered 2014-09-05 – 2014-09-07 (×3): 1 via ORAL
  Filled 2014-09-05 (×6): qty 1

## 2014-09-05 MED ORDER — LOPERAMIDE HCL 2 MG PO CAPS: 2.0000 mg | ORAL_CAPSULE | ORAL | Status: DC | PRN

## 2014-09-05 MED ORDER — VITAMIN B-1 100 MG PO TABS
100.0000 mg | ORAL_TABLET | Freq: Every day | ORAL | Status: DC
  Administered 2014-09-06 – 2014-09-07 (×2): 100 mg via ORAL
  Filled 2014-09-05 (×4): qty 1

## 2014-09-05 NOTE — ED Provider Notes (Signed)
CSN: [id number]     Arrival date & time 09/05/14  0417 History   First MD Initiated Contact with Patient 09/05/14 [phone]     Chief Complaint  Patient presents with  . Suicidal     (Consider location/radiation/quality/duration/timing/severity/associated sxs/prior Treatment) Patient is a 24 y.o. male presenting with drug problem.  Drug Problem This is a chronic problem. The current episode started more than 1 week ago. The problem occurs constantly. The problem has not changed since onset.Pertinent negatives include no chest pain, no headaches and no shortness of breath. Nothing aggravates the symptoms. Nothing relieves the symptoms. He has tried nothing for the symptoms. The treatment provided no relief.  I also suicidal with a plan to OD or jump in front of cars.  Sent from [facility] for clearance.    Past Medical History  Diagnosis Date  . Bipolar 1 disorder    History reviewed. No pertinent past surgical history. History reviewed. No pertinent family history. History  Substance Use Topics  . Smoking status: Current Every Day Smoker -- 1.00 packs/day    Types: Cigarettes  . Smokeless tobacco: Never Used  . Alcohol Use: 1.2 oz/week    2 Cans of beer per week     Comment: weekends    Review of Systems  Constitutional: Negative for fever.  Respiratory: Negative for shortness of breath.   Cardiovascular: Negative for chest pain, palpitations and leg swelling.  Neurological: Negative for headaches.  All other systems reviewed and are negative.     Allergies  Review of patient's allergies indicates no known allergies.  Home Medications   Prior to Admission medications   Medication Sig Start Date End Date Taking? Authorizing Provider  hydrocortisone (ANUSOL-HC) 2.5 % rectal cream Apply rectally 2 times daily Patient not taking: Reported on 09/05/2014 04/27/14   Tilden Fossa, MD  ibuprofen (ADVIL,MOTRIN) 800 MG tablet Take 1 tablet (800 mg total) by mouth 3 (three) times  daily. Patient not taking: Reported on 09/05/2014 12/24/12   Elson Areas, PA-C   BP 124/91 mmHg  Pulse 76  Temp(Src) 98 F (36.7 C) (Oral)  Resp 18  Ht  (1.778 m)  Wt 160 lb (72.576 kg)  BMI 22.96 kg/m2  SpO2 98% Physical Exam  Constitutional: He is oriented to person, place, and time. He appears well-developed and well-nourished. No distress.  HENT:  Head: Normocephalic and atraumatic.  Mouth/Throat: Oropharynx is clear and moist.  Eyes: Conjunctivae and EOM are normal. Pupils are equal, round, and reactive to light.  Neck: Normal range of motion. Neck supple.  Cardiovascular: Normal rate and regular rhythm.   Pulmonary/Chest: Effort normal and breath sounds normal. No respiratory distress. He has no wheezes. He has no rales.  Abdominal: Soft. Bowel sounds are normal. He exhibits no distension and no mass. There is no tenderness. There is no rebound and no guarding.  Musculoskeletal: Normal range of motion.  Neurological: He is oriented to person, place, and time.  Skin: Skin is warm and dry.  Psychiatric: His speech is not rapid and/or pressured. He expresses suicidal ideation. He expresses suicidal plans.    ED Course  Procedures (including critical care time) Labs Review Labs Reviewed  ACETAMINOPHEN LEVEL - Abnormal; Notable for the following:    Acetaminophen (Tylenol), Serum <10 (*)    All other components within normal limits  COMPREHENSIVE METABOLIC PANEL - Abnormal; Notable for the following:    Total Protein 8.4 (*)    AST 14 (*)    ALT 12 (*)  All other components within normal limits  ETHANOL - Abnormal; Notable for the following:    Alcohol, Ethyl (B) 13 (*)    All other components within normal limits  URINE RAPID DRUG SCREEN, HOSP PERFORMED - Abnormal; Notable for the following:    Cocaine POSITIVE (*)    Tetrahydrocannabinol POSITIVE (*)    All other components within normal limits  CBC  SALICYLATE LEVEL    Imaging Review No results  found.   EKG Interpretation None      MDM   Final diagnoses:  Suicidal ideation    [facility]  Will transfer back for inpatient management    Hogan Hoobler, MD 09/05/14 [phone]

## 2014-09-05 NOTE — [facility] Group Notes (Signed)
[facility] LCSW Aftercare Discharge Planning Group Note  09/05/2014  1:15 PM  Participation Quality: Did Not Attend. Patient invited to participate but declined.  Yahira Timberman, MSW, LCSWA Clinical Social Worker [facility] [phone]   
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
## 2014-09-05 NOTE — Tx Team (Signed)
Initial Interdisciplinary Treatment Plan   PATIENT STRESSORS: Substance abuse   PATIENT STRENGTHS: Average or above average intelligence General fund of knowledge Motivation for treatment/growth Supportive family/friends   PROBLEM LIST: Problem List/Patient Goals Date to be addressed Date deferred Reason deferred Estimated date of resolution  Polysubstance abuse 09/05/2014     depression 09/05/2014     anxiety 09/05/2014     Suicidal ideation 09/05/2014     "I want to recover from drugs and mentally" 09/05/2014                              DISCHARGE CRITERIA:  Improved stabilization in mood, thinking, and/or behavior Withdrawal symptoms are absent or subacute and managed without 24-hour nursing intervention  PRELIMINARY DISCHARGE PLAN: Attend PHP/IOP Attend 12-step recovery group Return to previous living arrangement  PATIENT/FAMIILY INVOLVEMENT: This treatment plan has been presented to and reviewed with the patient, Clayton Stafford he patient and family have been given the opportunity to ask questions and make suggestions.  [name], Clayton Stafford 09/05/2014, 6:34 AM

## 2014-09-05 NOTE — Progress Notes (Signed)
NSG shift assessment. 7a-7p.   D: Pt has spent the morning in bed with his eyes closed. He arouses when his name is called, but is slow to respond. He is alert and oriented x-4.  His slow response seems to be because he is not feeling well. He took his morning medications and was able to sit up to swallow them. Stayed in his room for lunch and continued sleeping after lunch. He did complete a Patient Self Inventory and his goal is to get clean and get on right medications.  Near dinner time he said that he was feeling a little better, but continued to stay in bed.   A:  Support and encouragement offered. Safety maintained with observations every 15 minutes.   R:  No complaints voiced.

## 2014-09-05 NOTE — Progress Notes (Addendum)
Patient on that hallway using phone at the beginning of the shift. His mood and affects flat and depressed. He denied SI/HI and denied Hallucinations. Patient reported that he just got to the unit this afternoon and that he is hungry. Writer offered patient a plate of food. He reported that the only withdrawal symptoms he has is "sweating". Writer encouraged and supported patient. Q 15 minute check continues as ordered to maintain safety.  Patient on the phone upset and cursing out. Writer asked patient who he was talking to and why he was upset. Patient said he was upset because his mother refused to pay for his girl friend to go for [place] in [place]. He said his girl friend's parents has no money and he wants his mother to pay for both of them to go to treatment in [place]. Patient seemed clueless and entitled.

## 2014-09-05 NOTE — ED Notes (Signed)
[facility], pt does have a bed available to him if clear. Pt states he feels worthless, endorses SI with plan to jump in front of a car. Pt states he is using crack, acid, weed, xanax and etoh,  would like help quitting these substances.

## 2014-09-05 NOTE — ED Notes (Signed)
[facility] here to transport pt to [facility] 

## 2014-09-05 NOTE — [facility] Suicide Risk Assessment (Signed)
[facility]   Nursing information obtained from:  Patient Demographic factors:  Male, Caucasian, Unemployed Current Mental Status:  Suicidal ideation indicated by patient Loss Factors:  NA Historical Factors:  Family history of mental illness or substance abuse Risk Reduction Factors:  Living with another person, especially a relative, Responsible for children under 23 years of age Total Time spent with patient: 45 minutes Principal Problem: <principal problem not specified> Diagnosis:   Patient Active Problem List   Diagnosis Date Noted  . Substance induced mood disorder [F19.94] 09/05/2014     Continued Clinical Symptoms:  Alcohol Use Disorder Identification Test Final Score (AUDIT): 36 The "Alcohol Use Disorders Identification Test", Guidelines for Use in Primary Care, Second Edition.  World [employer] writerHealth Organization [facility](WHO). Score between 0-7:  no or low risk or alcohol related problems. Score between 8-15:  moderate risk of alcohol related problems. Score between 16-19:  high risk of alcohol related problems. Score 20 or above:  warrants further diagnostic evaluation for alcohol dependence and treatment.   CLINICAL FACTORS:   Depression:   Comorbid alcohol abuse/dependence Impulsivity Alcohol/Substance Abuse/Dependencies  Psychiatric Specialty Exam: Physical Exam  ROS  Blood pressure 108/73, pulse 106, temperature 98.6 F (37 C), temperature source Oral, resp. rate 18, height 5\' 9"  (1.753 m), weight 71.668 kg (158 lb).Body mass index is 23.32 kg/(m^2).   COGNITIVE FEATURES THAT CONTRIBUTE TO RISK:  Closed-mindedness, Loss of executive function and Thought constriction (tunnel vision)    SUICIDE RISK:   Moderate:  Frequent suicidal ideation with limited intensity, and duration, some specificity in terms of plans, no associated intent, good self-control, limited dysphoria/symptomatology, some risk factors present, and identifiable protective factors,  including available and accessible social support.  PLAN OF CARE: Supportive approach/coping skills                               Polysubstance dependence; detox as necessary                                Mood instability; reassess for mood stabilizers                               Relapse prevention plan                               CBT/minfulness  Medical Decision Making:  Review of Psycho-Social Stressors (1), Review or order clinical lab tests (1), Review of Medication Regimen & Side Effects (2) and Review of New Medication or Change in Dosage (2)  I certify that inpatient services furnished can reasonably be expected to improve the patient's condition.   Randie Bloodgood A 09/05/2014, 9:49 PM

## 2014-09-05 NOTE — ED Notes (Signed)
Bed: [facility] Expected date:  Expected time:  Means of arrival:  Comments: 

## 2014-09-05 NOTE — Progress Notes (Signed)
Patient attended AA Speaker group tonight.  
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
## 2014-09-06 NOTE — Progress Notes (Signed)
D:  Per pt self inventory pt reports sleeping good, appetite good, energy level normal, ability to pay attention good, rates depression at a 3 out of 10, hopelessness at a 1 out of 10, anxiety at a 0 out of 10, denies SI/HI/AVH, pt states that he is feeling much better today and that his withdrawal symptoms are much improved, goal today:  "stay clean, get prepared for discharge tomorrow", denies SI/HI/AVH     A:  Emotional support provided, Encouraged pt to continue with treatment plan and attend all group activities, q15 min checks maintained for safety.  R:  Pt is going to some groups, otherwise he has been sleeping much of the day in his bed, pt states that [place], pleasant with staff and other patients on the unit, interacts appropriately.

## 2014-09-06 NOTE — Progress Notes (Signed)
Recreation Therapy Notes  Animal-Assisted Activity (AAA) Program Checklist/Progress Notes Patient Eligibility Criteria Checklist & Daily Group note for Rec Tx Intervention  Date: 07.05.16 Time: 230 pm Location: 400 Hall Dayroom   AAA/T Program Assumption of Risk Form signed by Patient/ or Parent Legal Guardian yes  Patient is free of allergies or sever asthma yes  Patient reports no fear of animals yes  Patient reports no history of cruelty to animalsyes  Patient understands his/her participation is voluntary yes  Patient washes hands before animal contact yes  Patient washes hands after animal contact yes  Education: Hand Washing, Appropriate Animal Interaction   Education Outcome: Acknowledges understanding/In group clarification offered/Needs additional education.   Clinical Observations/Feedback: Did not attend group.   Clayton Stafford, LRT/CTRS         Clayton Stafford A 09/06/2014 4:41 PM 

## 2014-09-06 NOTE — Progress Notes (Signed)
[facility] Group Notes:  (Nursing/MHT/Case Management/Adjunct)  Date:  09/06/2014  Time:  11:43 PM  Type of Therapy:  Psychoeducational Skills  Participation Level:  Active  Participation Quality:  Appropriate  Affect:  Appropriate  Cognitive:  Appropriate  Insight:  Appropriate  Engagement in Group:  Engaged  Modes of Intervention:  Discussion  Summary of Progress/Problems: Tonight in wrap up group Clayton Stafford said that today was a pretty good day (9 or 10) for him he feels like he has opened up more since he has come here and he feels like he is ready [place]   Clayton Stafford 09/06/2014, 11:43 PM
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
## 2014-09-06 NOTE — Progress Notes (Signed)
NUTRITION ASSESSMENT  Pt identified as at risk on the Malnutrition Screen Tool  INTERVENTION: 1. Educated patient on the importance of nutrition and encouraged intake of food and beverages. 2. Discussed weight goals. 3. Supplements: continue Ensure Enlive BID, each supplement provides 350 kcal and 20 grams of protein  NUTRITION DIAGNOSIS: Unintentional weight loss related to sub-optimal intake as evidenced by pt report.   Goal: Pt to meet >/= 90% of their estimated nutrition needs.  Monitor:  PO intake  Assessment:  Pt seen for MST. Unsure of total weight loss recently. Pt with extensive polysubstance abuse hx and was using up until day of admission. Pt's appetite has been improving since admission and he stated hunger last night.   Ensure Enlive ordered BID. Hesitant to order additional supplements at this time as pt may be at risk for refeeding due to heavy drug/alcohol use PTA.  24 y.o. male  Height: Ht Readings from Last 1 Encounters:  09/05/14 5\' 9"  (1.753 m)    Weight: Wt Readings from Last 1 Encounters:  09/05/14 158 lb (71.668 kg)    Weight Hx: Wt Readings from Last 10 Encounters:  09/05/14 158 lb (71.668 kg)  09/05/14 160 lb (72.576 kg)  12/24/12 175 lb (79.379 kg)    BMI:  Body mass index is 23.32 kg/(m^2). Pt meets criteria for normal weight status based on current BMI.  Estimated Nutritional Needs: Kcal: 25-30 kcal/kg Protein: > 1 gram protein/kg Fluid: 1 ml/kcal  Diet Order: Diet regular Room service appropriate?: Yes; Fluid consistency:: Thin Pt is also offered choice of unit snacks mid-morning and mid-afternoon.  Pt is eating as desired.   Lab results and medications reviewed.     Trenton GammonJessica Aviel Davalos, RD, LDN Inpatient Clinical Dietitian Pager # [phone] After hours/weekend pager # [phone]

## 2014-09-06 NOTE — [facility] Group Notes (Signed)
[facility] 09/06/2014 1:15pm  Type of Therapy: [facility] Presentation  Pt came in to group late, stayed for a short time and left.  Chad CordialLauren Carter, LCSWA 09/06/2014 1:34 PM

## 2014-09-06 NOTE — [facility] Group Notes (Signed)
Adult Psychoeducational Group Note  Date:  09/06/2014 Time:  0850am  Group Topic/Focus:  Goals Group:   The focus of this group is to help patients establish daily goals to achieve during treatment and discuss how the patient can incorporate goal setting into their daily lives to aide in recovery. Orientation:   The focus of this group is to educate the patient on the purpose and policies of crisis stabilization and provide a format to answer questions about their admission.  The group details unit policies and expectations of patients while admitted.  Participation Level:  Did Not Attend  Clayton Stafford 09/06/2014, 2:43 PM

## 2014-09-06 NOTE — Progress Notes (Signed)
[facility] MD Progress Note  09/06/2014 7:34 PM Clayton Stafford  MRN:  [id number] Subjective:  Clayton Stafford endorses that he wants to pursue further treatment by going to a half way house in [place]. States that he wants to get out of [facility]. States that irrespective of what happens with his GF, he wants to pursue this course of action. He expresses a lot of regrets for his past behavior the people he has hurt in the process. States he is really serious this time around Principal Problem: Substance induced mood disorder Diagnosis:   Patient Active Problem List   Diagnosis Date Noted  . Substance induced mood disorder [F19.94] 09/05/2014  . Polysubstance dependence [F19.20] 09/05/2014   Total Time spent with patient: 30 minutes   Past Medical History:  Past Medical History  Diagnosis Date  . Bipolar 1 disorder    History reviewed. No pertinent past surgical history. Family History: History reviewed. No pertinent family history. Social History:  History  Alcohol Use  . 1.2 oz/week  . 2 Cans of beer per week    Comment: weekends     History  Drug Use  . Yes  . Special: IV, "Crack" cocaine, Cocaine, Heroin, Methamphetamines, Marijuana, LSD    Comment: heroin    History   Social History  . Marital Status: Single    Spouse Name: N/A  . Number of Children: N/A  . Years of Education: N/A   Social History Main Topics  . Smoking status: Current Every Day Smoker -- 2.00 packs/day    Types: Cigarettes  . Smokeless tobacco: Never Used  . Alcohol Use: 1.2 oz/week    2 Cans of beer per week     Comment: weekends  . Drug Use: Yes    Special: IV, "Crack" cocaine, Cocaine, Heroin, Methamphetamines, Marijuana, LSD     Comment: heroin  . Sexual Activity: Not on file   Other Topics Concern  . None   Social History Narrative   Additional History:    Sleep: Fair  Appetite:  Fair   Assessment:   Musculoskeletal: Strength & Muscle Tone: within normal limits Gait  & Station: normal Patient leans: normal   Psychiatric Specialty Exam: Physical Exam  Review of Systems  Constitutional: Negative.   HENT: Negative.   Eyes: Negative.   Respiratory: Negative.   Cardiovascular: Negative.   Gastrointestinal: Negative.   Genitourinary: Negative.   Musculoskeletal: Negative.   Skin: Negative.   Neurological: Negative.   Endo/Heme/Allergies: Negative.   Psychiatric/Behavioral: Positive for depression and substance abuse. The patient is nervous/anxious.     Blood pressure 103/87, pulse 126, temperature 97.6 F (36.4 C), temperature source Oral, resp. rate 18, height 5' 9"  (1.753 m), weight 71.668 kg (158 lb).Body mass index is 23.32 kg/(m^2).  General Appearance: Fairly Groomed  Engineer, water::  Fair  Speech:  Clear and Coherent  Volume:  Normal  Mood:  Anxious and worried  Affect:  anxious worried  Thought Process:  Coherent and Goal Directed  Orientation:  Full (Time, Place, and Person)  Thought Content:  symptoms events worries concerns  Suicidal Thoughts:  No  Homicidal Thoughts:  No  Memory:  Immediate;   Fair Recent;   Fair Remote;   Fair  Judgement:  Fair  Insight:  Present  Psychomotor Activity:  Restlessness  Concentration:  Fair  Recall:  [employer] [place]  Language: Fair  Akathisia:  No  Handed:  Right  AIMS (if indicated):     Assets:  Desire for Improvement  ADL's:  Intact  Cognition: WNL  Sleep:        Current Medications: Current Facility-Administered Medications  Medication Dose Route Frequency Provider Last Rate Last Dose  . acetaminophen (TYLENOL) tablet 650 mg  650 mg Oral Q6H PRN Laverle Hobby, PA-C      . alum & mag hydroxide-simeth (MAALOX/MYLANTA) 200-200-20 MG/5ML suspension 30 mL  30 mL Oral Q4H PRN Laverle Hobby, PA-C      . chlordiazePOXIDE (LIBRIUM) capsule 25 mg  25 mg Oral Q6H PRN Laverle Hobby, PA-C      . [START ON 09/07/2014] chlordiazePOXIDE (LIBRIUM) capsule 25 mg  25 mg Oral BH-qamhs  Spencer E Simon, PA-C       Followed by  . [START ON 09/08/2014] chlordiazePOXIDE (LIBRIUM) capsule 25 mg  25 mg Oral Daily Laverle Hobby, PA-C      . feeding supplement (ENSURE ENLIVE) (ENSURE ENLIVE) liquid 237 mL  237 mL Oral BID BM Nicholaus Bloom, MD   237 mL at 09/06/14 1028  . hydrOXYzine (ATARAX/VISTARIL) tablet 25 mg  25 mg Oral Q6H PRN Laverle Hobby, PA-C      . loperamide (IMODIUM) capsule 2-4 mg  2-4 mg Oral PRN Laverle Hobby, PA-C      . magnesium hydroxide (MILK OF MAGNESIA) suspension 30 mL  30 mL Oral Daily PRN Laverle Hobby, PA-C      . multivitamin with minerals tablet 1 tablet  1 tablet Oral Daily Laverle Hobby, PA-C   1 tablet at 09/06/14 0808  . nicotine (NICODERM CQ - dosed in mg/24 hours) patch 21 mg  21 mg Transdermal Daily Nicholaus Bloom, MD   21 mg at 09/06/14 0808  . ondansetron (ZOFRAN-ODT) disintegrating tablet 4 mg  4 mg Oral Q6H PRN Laverle Hobby, PA-C      . thiamine (B-1) injection 100 mg  100 mg Intramuscular Once Laverle Hobby, PA-C   100 mg at 09/05/14 1242  . thiamine (VITAMIN B-1) tablet 100 mg  100 mg Oral Daily Laverle Hobby, PA-C   100 mg at 09/06/14 6629  . traZODone (DESYREL) tablet 50 mg  50 mg Oral QHS,MR X 1 Laverle Hobby, PA-C   50 mg at 09/05/14 2202    Lab Results:  Results for orders placed or performed during the hospital encounter of 09/05/14 (from the past 48 hour(s))  Urine rapid drug screen (hosp performed)not at [facility]     Status: Abnormal   Collection Time: 09/05/14  4:39 AM  Result Value Ref Range   Opiates NONE DETECTED NONE DETECTED   Cocaine POSITIVE (A) NONE DETECTED   Benzodiazepines NONE DETECTED NONE DETECTED   Amphetamines NONE DETECTED NONE DETECTED   Tetrahydrocannabinol POSITIVE (A) NONE DETECTED   Barbiturates NONE DETECTED NONE DETECTED    Comment:        DRUG SCREEN FOR MEDICAL PURPOSES ONLY.  IF CONFIRMATION IS NEEDED FOR ANY PURPOSE, NOTIFY LAB WITHIN 5 DAYS.        LOWEST DETECTABLE LIMITS FOR URINE  DRUG SCREEN Drug Class       Cutoff (ng/mL) Amphetamine      1000 Barbiturate      200 Benzodiazepine   476 Tricyclics       546 Opiates          300 Cocaine          300 THC  50   Acetaminophen level     Status: Abnormal   Collection Time: 09/05/14  4:45 AM  Result Value Ref Range   Acetaminophen (Tylenol), Serum <10 (L) 10 - 30 ug/mL    Comment:        THERAPEUTIC CONCENTRATIONS VARY SIGNIFICANTLY. A RANGE OF 10-30 ug/mL MAY BE AN EFFECTIVE CONCENTRATION FOR MANY PATIENTS. HOWEVER, SOME ARE BEST TREATED AT CONCENTRATIONS OUTSIDE THIS RANGE. ACETAMINOPHEN CONCENTRATIONS >150 ug/mL AT 4 HOURS AFTER INGESTION AND >50 ug/mL AT 12 HOURS AFTER INGESTION ARE OFTEN ASSOCIATED WITH TOXIC REACTIONS.   CBC     Status: None   Collection Time: 09/05/14  4:45 AM  Result Value Ref Range   WBC 9.2 4.0 - 10.5 K/uL   RBC 5.22 4.22 - 5.81 MIL/uL   Hemoglobin 15.8 13.0 - 17.0 g/dL   HCT 46.2 39.0 - 52.0 %   MCV 88.5 78.0 - 100.0 fL   MCH 30.3 26.0 - 34.0 pg   MCHC 34.2 30.0 - 36.0 g/dL   RDW 13.3 11.5 - 15.5 %   Platelets 304 150 - 400 K/uL  Comprehensive metabolic panel     Status: Abnormal   Collection Time: 09/05/14  4:45 AM  Result Value Ref Range   Sodium 139 135 - 145 mmol/L   Potassium 3.7 3.5 - 5.1 mmol/L   Chloride 101 101 - 111 mmol/L   CO2 28 22 - 32 mmol/L   Glucose, Bld 88 65 - 99 mg/dL   BUN 8 6 - 20 mg/dL   Creatinine, Ser 0.89 0.61 - 1.24 mg/dL   Calcium 9.6 8.9 - 10.3 mg/dL   Total Protein 8.4 (H) 6.5 - 8.1 g/dL   Albumin 5.0 3.5 - 5.0 g/dL   AST 14 (L) 15 - 41 U/L   ALT 12 (L) 17 - 63 U/L   Alkaline Phosphatase 88 38 - 126 U/L   Total Bilirubin 0.8 0.3 - 1.2 mg/dL   GFR calc non Af Amer >60 >60 mL/min   GFR calc Af Amer >60 >60 mL/min    Comment: (NOTE) The eGFR has been calculated using the CKD EPI equation. This calculation has not been validated in all clinical situations. eGFR's persistently <60 mL/min signify possible Chronic  Kidney Disease.    Anion gap 10 5 - 15  Ethanol (ETOH)     Status: Abnormal   Collection Time: 09/05/14  4:45 AM  Result Value Ref Range   Alcohol, Ethyl (B) 13 (H) <5 mg/dL    Comment:        LOWEST DETECTABLE LIMIT FOR SERUM ALCOHOL IS 5 mg/dL FOR MEDICAL PURPOSES ONLY   Salicylate level     Status: None   Collection Time: 09/05/14  4:45 AM  Result Value Ref Range   Salicylate Lvl <6.8 2.8 - 30.0 mg/dL    Physical Findings: AIMS: Facial and Oral Movements Muscles of Facial Expression: None, normal Lips and Perioral Area: None, normal Jaw: None, normal Tongue: None, normal,Extremity Movements Upper (arms, wrists, hands, fingers): None, normal Lower (legs, knees, ankles, toes): None, normal, Trunk Movements Neck, shoulders, hips: None, normal, Overall Severity Severity of abnormal movements (highest score from questions above): None, normal Incapacitation due to abnormal movements: None, normal Patient's awareness of abnormal movements (rate only patient's report): No Awareness, Dental Status Current problems with teeth and/or dentures?: No Does patient usually wear dentures?: No  CIWA:  CIWA-Ar Total: 0 COWS:     Treatment Plan Summary: Daily contact with patient to assess and  evaluate symptoms and progress in treatment and Medication management Supportive approach/coping skills Polysubstance Dependence; continue to detox as needed/work a relapse prevention plan Mood instability; continue to reassess for the use of psychotropics CBT/MINfulness Facilitate getting to that program  Medical Decision Making:  Review of Psycho-Social Stressors (1), Review of Medication Regimen & Side Effects (2) and Review of New Medication or Change in Dosage (2)     Izetta Sakamoto A 09/06/2014, 7:34 PM

## 2014-09-06 NOTE — Progress Notes (Signed)
D: Clayton Stafford denies SI/HI/AVH. He says he is looking forward to discharge, which he says is Wednesday at 1300. He has been appropriate on the unit.  A: Meds given as ordered. Q15 safety checks maintained. Support/encouragement offered. R: Pt remains free from harm and continues with treatment. Will continue to monitor for needs/safety.

## 2014-09-07 MED ORDER — CHLORDIAZEPOXIDE HCL 25 MG PO CAPS: 25.0000 mg | ORAL_CAPSULE | ORAL | Status: DC

## 2014-09-07 MED ORDER — TRAZODONE HCL 50 MG PO TABS: 50.0000 mg | ORAL_TABLET | Freq: Every evening | ORAL | Status: DC | PRN

## 2014-09-07 MED ORDER — CHLORDIAZEPOXIDE HCL 25 MG PO CAPS: 25.0000 mg | ORAL_CAPSULE | Freq: Four times a day (QID) | ORAL | Status: DC | PRN

## 2014-09-07 MED ORDER — NICOTINE 21 MG/24HR TD PT24: 21.0000 mg | MEDICATED_PATCH | Freq: Every day | TRANSDERMAL | Status: DC

## 2014-09-07 NOTE — [facility] Suicide Risk Assessment (Signed)
[facility] INPATIENT:  Family/Significant Other Suicide Prevention Education  Suicide Prevention Education:  Education Completed; Lew DawesMichelle Schirtzinger, Pt's mother, has been identified by the patient as the family member/significant other with whom the patient will be residing, and identified as the person(s) who will aid the patient in the event of a mental health crisis (suicidal ideations/suicide attempt).  With written consent from the patient, the family member/significant other has been provided the following suicide prevention education, prior to the and/or following the discharge of the patient.  The suicide prevention education provided includes the following:  Suicide risk factors  Suicide prevention and interventions  National Suicide Hotline telephone number  [facility] assessment telephone number  [facility] 911  [facility] and/or Residential [facility] Crisis Unit telephone number  Request made of family/significant other to:  Remove weapons (e.g., guns, rifles, knives), all items previously/currently identified as safety concern.    Remove drugs/medications (over-the-counter, prescriptions, illicit drugs), all items previously/currently identified as a safety concern.  The family member/significant other verbalizes understanding of the suicide prevention education information provided.  The family member/significant other agrees to remove the items of safety concern listed above.  Elaina Hoopsarter, Alana [place] M 09/07/2014, 1:29 PM

## 2014-09-07 NOTE — Discharge Summary (Signed)
Physician Discharge Summary Note  Patient:  Clayton Stafford is an 24 y.o., male MRN:  [id number] DOB:  [birth year] Patient phone:  [phone] (home)  Patient address:   [address]  [place] [facility] [place] [id number],  Total Time spent with patient: 45 minutes  Date of Admission:  09/05/2014 Date of Discharge: 09/07/2014  Reason for Admission:  Substance   Principal Problem: Substance induced mood disorder Discharge Diagnoses: Patient Active Problem List   Diagnosis Date Noted  . Substance induced mood disorder [F19.94] 09/05/2014  . Polysubstance dependence [F19.20] 09/05/2014    Musculoskeletal: Strength & Muscle Tone: within normal limits Gait & Station: normal Patient leans: N/A  Psychiatric Specialty Exam:  SEE SRA Physical Exam  Vitals reviewed.   Review of Systems  All other systems reviewed and are negative.   Blood pressure 115/66, pulse 79, temperature 97.9 F (36.6 C), temperature source Oral, resp. rate 16, height 5' 9"  (1.753 m), weight 71.668 kg (158 lb).Body mass index is 23.32 kg/(m^2).  Have you used any form of tobacco in the last 30 days? (Cigarettes, Smokeless Tobacco, Cigars, and/or Pipes): Yes  Has this patient used any form of tobacco in the last 30 days? (Cigarettes, Smokeless Tobacco, Cigars, and/or Pipes) N/A  Past Medical History:  Past Medical History  Diagnosis Date  . Bipolar 1 disorder    History reviewed. No pertinent past surgical history. Family History: History reviewed. No pertinent family history. Social History:  History  Alcohol Use  . 1.2 oz/week  . 2 Cans of beer per week    Comment: weekends     History  Drug Use  . Yes  . Special: IV, "Crack" cocaine, Cocaine, Heroin, Methamphetamines, Marijuana, LSD    Comment: heroin    History   Social History  . Marital Status: Single    Spouse Name: N/A  . Number of Children: N/A  . Years of Education: N/A   Social History Main Topics  . Smoking status: Current Every Day  Smoker -- 2.00 packs/day    Types: Cigarettes  . Smokeless tobacco: Never Used  . Alcohol Use: 1.2 oz/week    2 Cans of beer per week     Comment: weekends  . Drug Use: Yes    Special: IV, "Crack" cocaine, Cocaine, Heroin, Methamphetamines, Marijuana, LSD     Comment: heroin  . Sexual Activity: Not on file   Other Topics Concern  . None   Social History Narrative   Risk to Self: Is patient at risk for suicide?: Yes What has been your use of drugs/alcohol within the last 12 months?: 2 gr cocaine/day,  Risk to Others:   Prior Inpatient Therapy:   Prior Outpatient Therapy:    Level of Care:  OP  Hospital Course:  KEVAN PROUTY was admitted for Substance induced mood disorder and crisis management.  He was treated discharged with the medications listed below under Medication List.  Medical problems were identified and treated as needed.  Home medications were restarted as appropriate.  Improvement was monitored by observation and Adria Devon daily report of symptom reduction.  Emotional and mental status was monitored by daily self-inventory reports completed by Adria Devon and clinical staff.         EYOEL THROGMORTON was evaluated by the treatment team for stability and plans for continued recovery upon discharge.  NORMAN PIACENTINI motivation was an integral factor for scheduling further treatment.  Employment, transportation, bed availability, health status, family support, and  any pending legal issues were also considered during his hospital stay.  He was offered further treatment options upon discharge including but not limited to Residential, Intensive Outpatient, and Outpatient treatment.  JAIREN GOLDFARB will follow up with the services as listed below under Follow Up Information.     Upon completion of this admission the patient was both mentally and medically stable for discharge denying suicidal/homicidal ideation, auditory/visual/tactile  hallucinations, delusional thoughts and paranoia.      Consults:  psychiatry  Significant Diagnostic Studies:  labs: per ED  Discharge Vitals:   Blood pressure 115/66, pulse 79, temperature 97.9 F (36.6 C), temperature source Oral, resp. rate 16, height 5' 9"  (1.753 m), weight 71.668 kg (158 lb). Body mass index is 23.32 kg/(m^2). Lab Results:   Results for orders placed or performed during the hospital encounter of 09/05/14 (from the past 72 hour(s))  Urine rapid drug screen (hosp performed)not at [facility]     Status: Abnormal   Collection Time: 09/05/14  4:39 AM  Result Value Ref Range   Opiates NONE DETECTED NONE DETECTED   Cocaine POSITIVE (A) NONE DETECTED   Benzodiazepines NONE DETECTED NONE DETECTED   Amphetamines NONE DETECTED NONE DETECTED   Tetrahydrocannabinol POSITIVE (A) NONE DETECTED   Barbiturates NONE DETECTED NONE DETECTED    Comment:        DRUG SCREEN FOR MEDICAL PURPOSES ONLY.  IF CONFIRMATION IS NEEDED FOR ANY PURPOSE, NOTIFY LAB WITHIN 5 DAYS.        LOWEST DETECTABLE LIMITS FOR URINE DRUG SCREEN Drug Class       Cutoff (ng/mL) Amphetamine      1000 Barbiturate      200 Benzodiazepine   109 Tricyclics       323 Opiates          300 Cocaine          300 THC              50   Acetaminophen level     Status: Abnormal   Collection Time: 09/05/14  4:45 AM  Result Value Ref Range   Acetaminophen (Tylenol), Serum <10 (L) 10 - 30 ug/mL    Comment:        THERAPEUTIC CONCENTRATIONS VARY SIGNIFICANTLY. A RANGE OF 10-30 ug/mL MAY BE AN EFFECTIVE CONCENTRATION FOR MANY PATIENTS. HOWEVER, SOME ARE BEST TREATED AT CONCENTRATIONS OUTSIDE THIS RANGE. ACETAMINOPHEN CONCENTRATIONS >150 ug/mL AT 4 HOURS AFTER INGESTION AND >50 ug/mL AT 12 HOURS AFTER INGESTION ARE OFTEN ASSOCIATED WITH TOXIC REACTIONS.   CBC     Status: None   Collection Time: 09/05/14  4:45 AM  Result Value Ref Range   WBC 9.2 4.0 - 10.5 K/uL   RBC 5.22 4.22 - 5.81 MIL/uL   Hemoglobin  15.8 13.0 - 17.0 g/dL   HCT 46.2 39.0 - 52.0 %   MCV 88.5 78.0 - 100.0 fL   MCH 30.3 26.0 - 34.0 pg   MCHC 34.2 30.0 - 36.0 g/dL   RDW 13.3 11.5 - 15.5 %   Platelets 304 150 - 400 K/uL  Comprehensive metabolic panel     Status: Abnormal   Collection Time: 09/05/14  4:45 AM  Result Value Ref Range   Sodium 139 135 - 145 mmol/L   Potassium 3.7 3.5 - 5.1 mmol/L   Chloride 101 101 - 111 mmol/L   CO2 28 22 - 32 mmol/L   Glucose, Bld 88 65 - 99 mg/dL   BUN 8 6 - 20 mg/dL  Creatinine, Ser 0.89 0.61 - 1.24 mg/dL   Calcium 9.6 8.9 - 10.3 mg/dL   Total Protein 8.4 (H) 6.5 - 8.1 g/dL   Albumin 5.0 3.5 - 5.0 g/dL   AST 14 (L) 15 - 41 U/L   ALT 12 (L) 17 - 63 U/L   Alkaline Phosphatase 88 38 - 126 U/L   Total Bilirubin 0.8 0.3 - 1.2 mg/dL   GFR calc non Af Amer >60 >60 mL/min   GFR calc Af Amer >60 >60 mL/min    Comment: (NOTE) The eGFR has been calculated using the CKD EPI equation. This calculation has not been validated in all clinical situations. eGFR's persistently <60 mL/min signify possible Chronic Kidney Disease.    Anion gap 10 5 - 15  Ethanol (ETOH)     Status: Abnormal   Collection Time: 09/05/14  4:45 AM  Result Value Ref Range   Alcohol, Ethyl (B) 13 (H) <5 mg/dL    Comment:        LOWEST DETECTABLE LIMIT FOR SERUM ALCOHOL IS 5 mg/dL FOR MEDICAL PURPOSES ONLY   Salicylate level     Status: None   Collection Time: 09/05/14  4:45 AM  Result Value Ref Range   Salicylate Lvl <4.7 2.8 - 30.0 mg/dL    Physical Findings: AIMS: Facial and Oral Movements Muscles of Facial Expression: None, normal Lips and Perioral Area: None, normal Jaw: None, normal Tongue: None, normal,Extremity Movements Upper (arms, wrists, hands, fingers): None, normal Lower (legs, knees, ankles, toes): None, normal, Trunk Movements Neck, shoulders, hips: None, normal, Overall Severity Severity of abnormal movements (highest score from questions above): None, normal Incapacitation due to  abnormal movements: None, normal Patient's awareness of abnormal movements (rate only patient's report): No Awareness, Dental Status Current problems with teeth and/or dentures?: No Does patient usually wear dentures?: No  CIWA:  CIWA-Ar Total: 0 COWS:      See Psychiatric Specialty Exam and Suicide Risk Assessment completed by Attending Physician prior to discharge.  Discharge destination:  Home  Is patient on multiple antipsychotic therapies at discharge:  No   Has Patient had three or more failed trials of antipsychotic monotherapy by history:  No    Recommended Plan for Multiple Antipsychotic Therapies: NA     Medication List    STOP taking these medications        hydrocortisone 2.5 % rectal cream  Commonly known as:  ANUSOL-HC     ibuprofen 800 MG tablet  Commonly known as:  ADVIL,MOTRIN      TAKE these medications      Indication   nicotine 21 mg/24hr patch  Commonly known as:  NICODERM CQ - dosed in mg/24 hours  Place 1 patch (21 mg total) onto the skin daily.   Indication:  Nicotine Addiction     traZODone 50 MG tablet  Commonly known as:  DESYREL  Take 1 tablet (50 mg total) by mouth at bedtime and may repeat dose one time if needed.   Indication:  Trouble Sleeping, Major Depressive Disorder           Follow-up Information    Follow up with [facility] .   Why:  Please follow-up at the number above for continued outpatient services (doctor and therapist).   Contact information:   [place] [address] [place] [address]       Follow-up recommendations:  Activity:  as tol, diet as tol  Comments:  1.  Take all your medications as prescribed.              2.  Report any adverse side effects to outpatient provider.                       3.  Patient instructed to not use alcohol or illegal drugs while on prescription medicines.            4.  In the event of worsening symptoms,  instructed patient to call 911, the crisis hotline or go to nearest emergency room for evaluation of symptoms.  Total Discharge Time: 30 min  Signed: Freda Munro May Agustin AGNP-BC 09/07/2014, 7:52 PM  I personally assessed the patient and formulated the plan Geralyn Flash A. Sabra Heck, M.D.

## 2014-09-07 NOTE — BHH Group Notes (Signed)
[facility]  09/07/2014 8:45 AM  Participation Quality: Alert, Appropriate and Oriented  Mood/Affect: appropriate  Depression Rating:   Anxiety Rating:   Thoughts of Suicide: Pt denies SI/HI  Will you contract for safety? Yes  Current AVH: Pt denies  Plan for Discharge/Comments: Pt attended discharge planning group and actively participated in group. CSW discussed suicide prevention education with the group and encouraged them to discuss discharge planning and any relevant barriers. Pt reports that he is discharging today and will visit family in [place] and then go to a half-way house in [place]. Pt requests letter stating he was in the hospital.  Transportation Means: Pt reports access to transportation  Supports: No supports mentioned at this time  Chad CordialLauren Carter, LCSWA 09/07/2014 10:10 AM

## 2014-09-07 NOTE — Progress Notes (Signed)
Recreation Therapy Notes  Date: 07.06.16 Time: 930 am Location: [address] Group Room  Group Topic: Stress Management  Goal Area(s) Addresses:  Patient will verbalize importance of using healthy stress management.  Patient will identify positive emotions associated with healthy stress management.   Intervention: Stress Management  Activity :  Guided Imagery.  LRT introduced and educated patients on the stress management technique of guided imagery.  A script was used to deliver the technique to patients.  Patients were asked to follow the script read a loud by LRT to engage in practicing the stress management technique.  Education:  Stress Management, Discharge Planning.   Education Outcome: Acknowledges edcuation/In group clarification offered/Needs additional education  Clinical Observations/Feedback: Did not attend group.    Clayton Stafford, LRT/CTRS         Clayton Stafford A 09/07/2014 3:47 PM 

## 2014-09-07 NOTE — BHH Suicide Risk Assessment (Signed)
[facility]   Demographic Factors:  Male and Caucasian  Total Time spent with patient: 30 minutes  Musculoskeletal: Strength & Muscle Tone: within normal limits Gait & Station: normal Patient leans: normal  Psychiatric Specialty Exam: Physical Exam  Review of Systems  Constitutional: Negative.   HENT: Negative.   Eyes: Negative.   Respiratory: Negative.   Cardiovascular: Negative.   Gastrointestinal: Negative.   Genitourinary: Negative.   Musculoskeletal: Negative.   Skin: Negative.   Neurological: Negative.   Endo/Heme/Allergies: Negative.   Psychiatric/Behavioral: Positive for substance abuse.    Blood pressure 115/66, pulse 79, temperature 97.9 F (36.6 C), temperature source Oral, resp. rate 16, height 5\' 9"  (1.753 m), weight 71.668 kg (158 lb).Body mass index is 23.32 kg/(m^2).  General Appearance: Fairly Groomed  Patent attorneyye Contact::  Fair  Speech:  Clear and Coherent409  Volume:  Increased  Mood:  Euthymic  Affect:  Appropriate  Thought Process:  Coherent and Goal Directed  Orientation:  Full (Time, Place, and Person)  Thought Content:  plans as he moves on, relapse prevention plan  Suicidal Thoughts:  No  Homicidal Thoughts:  No  Memory:  Immediate;   Fair Recent;   Fair Remote;   Fair  Judgement:  Fair  Insight:  Present  Psychomotor Activity:  Normal  Concentration:  Fair  Recall:  FiservFair  Fund of Knowledge:Fair  Language: Fair  Akathisia:  No  Handed:  Right  AIMS (if indicated):     Assets:  Desire for Improvement Social Support  Sleep:     Cognition: WNL  ADL's:  Intact   Have you used any form of tobacco in the last 30 days? (Cigarettes, Smokeless Tobacco, Cigars, and/or Pipes): Yes  Has this patient used any form of tobacco in the last 30 days? (Cigarettes, Smokeless Tobacco, Cigars, and/or Pipes) Yes, A prescription for an FDA-approved tobacco cessation medication was offered at discharge and the patient refused  Mental  Status Per Nursing Assessment::   On Admission:  Suicidal ideation indicated by patient  Current Mental Status by Physician: In full contact with reality. There are no active SI plans or intent. There are no active S/S of withdrawal. He states he is ready to make the changes in his life that he has to make. He is going to a half way house in [place] to pursue further treatment. Would like to get himself established out of this area to get a brand new start. Will like to be in a relationship with his GF but states he is focusing on getting healthy himself first.   Loss Factors: Legal issues  Historical Factors: NA  Risk Reduction Factors:   Sense of responsibility to family and Positive social support  Continued Clinical Symptoms:  Alcohol/Substance Abuse/Dependencies  Cognitive Features That Contribute To Risk:  Closed-mindedness, Polarized thinking and Thought constriction (tunnel vision)    Suicide Risk:  Minimal: No identifiable suicidal ideation.  Patients presenting with no risk factors but with morbid ruminations; may be classified as minimal risk based on the severity of the depressive symptoms  Principal Problem: Substance induced mood disorder Discharge Diagnoses:  Patient Active Problem List   Diagnosis Date Noted  . Substance induced mood disorder [F19.94] 09/05/2014  . Polysubstance dependence [F19.20] 09/05/2014      Plan Of Care/Follow-up recommendations:  Activity:  as tolerated Diet:  regular Will arrange his own follow up when [place] gets to [place] Is patient on multiple antipsychotic therapies at discharge:  No  Has Patient had three or more failed trials of antipsychotic monotherapy by history:  No  Recommended Plan for Multiple Antipsychotic Therapies: NA    Arthea Nobel A 09/07/2014, 11:16 AM

## 2014-09-07 NOTE — Progress Notes (Signed)
  [facility] :  Will you be returning to the same living situation after discharge:  No. Pt moving to half-way house in [place] At discharge, do you have transportation home?: Yes,  mother provided transportation Do you have the ability to pay for your medications: Yes,  Pt provided with samples and prescriptions  Release of information consent forms completed and in the chart;  Patient's signature needed at discharge.  Patient to Follow up at: Follow-up Information    [facility] .   Why:  Please follow-up at the number above for continued outpatient services (doctor and therapist).   Contact information:   [facility] [id number] [place], [place] [id number]       Patient denies SI/HI: Yes,  Pt denies    Safety Planning and Suicide Prevention discussed: Yes,  with mother. See SPE note for further detail  Have you used any form of tobacco in the last 30 days? (Cigarettes, Smokeless Tobacco, Cigars, and/or Pipes): Yes  Has patient been referred to the Quitline?: Patient refused referral  Elaina HoopsCarter, Wess Baney M 09/07/2014, 1:30 PM

## 2014-09-07 NOTE — Progress Notes (Signed)
RN 2300 Shift  D: Pt in bed resting with eyes closed. Respirations even and unlabored. Pt appears to be in no signs of distress at this time. A: Q15min checks remains for this pt. R: Pt remains safe at this time.   

## 2014-09-07 NOTE — Progress Notes (Signed)
Patient ID: Clayton Stafford, male   DOB: 10/14/1990, 24 y.o.   MRN: [id number] Patient discharged per MD orders. Patient given education regarding follow-up appointments and medications. Patient denies any questions or concerns about these instructions. Patient was escorted to locker and given belongings before discharge to hospital lobby. Patient currently denies SI/HI and auditory and visual hallucinations on discharge.

## 2015-12-26 ENCOUNTER — Telehealth: Payer: Self-pay | Admitting: General Practice

## 2015-12-26 NOTE — Telephone Encounter (Signed)
Patient's father called asking if Dr. Abner GreenspanBlyth would take patient (his son) since she sees him and his wife. Please advise.

## 2015-12-26 NOTE — Telephone Encounter (Signed)
Yes I will accept the son

## 2015-12-27 ENCOUNTER — Encounter: Payer: Self-pay | Admitting: Osteopathic Medicine

## 2015-12-27 ENCOUNTER — Ambulatory Visit (INDEPENDENT_AMBULATORY_CARE_PROVIDER_SITE_OTHER): Payer: BLUE CROSS/BLUE SHIELD | Admitting: Osteopathic Medicine

## 2015-12-27 VITALS — BP 139/60 | HR 62 | Ht 70.0 in | Wt 179.0 lb

## 2015-12-27 DIAGNOSIS — Z8249 Family history of ischemic heart disease and other diseases of the circulatory system: Secondary | ICD-10-CM | POA: Diagnosis not present

## 2015-12-27 DIAGNOSIS — F3170 Bipolar disorder, currently in remission, most recent episode unspecified: Secondary | ICD-10-CM | POA: Insufficient documentation

## 2015-12-27 MED ORDER — QUETIAPINE FUMARATE 100 MG PO TABS: 100.0000 mg | ORAL_TABLET | Freq: Every day | ORAL | 1 refills | Status: DC

## 2015-12-27 NOTE — Patient Instructions (Signed)
Plan to follow-up in 3 months for medication management and labs. Call or message me sooner if there is anything you need!      Thank you for coming in today. You should receive an email asking you to complete a brief survey regarding your experience [facility] at [facility]. Please take a moment to respond to this survey. Our goal is to serve you! Constructive criticism helps us to improve, and positive feedback helps our practice to shine, plus it makes us smile! Thanks in advance for your feedback.

## 2015-12-27 NOTE — Telephone Encounter (Signed)
LVM making patient aware.

## 2015-12-27 NOTE — Progress Notes (Signed)
HPI: Clayton Stafford is a 25 y.o. male  who [facility] [place] [facility], 12/[facility]/15,  for chief complaint of:  Chief Complaint  Patient presents with  . Establish Care    SLEEP CONCERNS NEED MEDICATION REFILL     . Recently moved [place] here from [place]. Has been without his Seroquel for maybe a month but then was taking one half of current roommate tablets, equaling about 50 mg daily at bedtime.  Marland Kitchen. History of bipolar disorder, previous hospitalization, history of drug dependence but currently in remission, patient is going to meetings, has been seen by multiple addiction specialists per patient, does not think he needs to see another specialist at this point but states definitely has noticed a difference in his mood and sleep pattern since being off of Seroquel    Patient is accompanied by mom who assists with history-taking.   Past medical, surgical, social and family history reviewed: Past Medical History:  Diagnosis Date  . Bipolar 1 disorder (HCC)    No past surgical history on file. Social History  Substance Use Topics  . Smoking status: Current Every Day Smoker    Packs/day: 2.00    Types: Cigarettes  . Smokeless tobacco: Never Used  . Alcohol use 1.2 oz/week    2 Cans of beer per week     Comment: weekends   Family History  Problem Relation Age of Onset  . Depression Father   . Heart attack Father      Current medication list and allergy/intolerance information reviewed:   Current Outpatient Prescriptions  Medication Sig Dispense Refill  . QUEtiapine (SEROQUEL) 200 MG tablet      No current facility-administered medications for this visit.    No Known Allergies    Review of Systems:  Constitutional:  No  fever, no chills, No recent illness, No unintentional weight changes. No significant fatigue.   HEENT: No  headache, no vision change, no hearing change, No sore throat, No  sinus pressure  Cardiac: No  chest pain,  No  pressure, No palpitations, No  Orthopnea  Respiratory:  No  shortness of breath. No  Cough  Gastrointestinal: No  abdominal pain, No  nausea, No  vomiting,  No  blood in stool, No  diarrhea, No  constipation   Musculoskeletal: No new myalgia/arthralgia  Genitourinary: No  incontinence, No  abnormal genital bleeding, No abnormal genital discharge  Skin: No  Rash, No other wounds/concerning lesions  Hem/Onc: No  easy bruising/bleeding, No  abnormal lymph node  Endocrine: No cold intolerance,  No heat intolerance. No polyuria/polydipsia/polyphagia   Neurologic: No  weakness, No  dizziness, No  slurred speech/focal weakness/facial droop  Psychiatric: +concerns with depression, +concerns with anxiety, +sleep problems, No mood problems  Exam:  BP 139/60   Pulse 62   Ht 5\' 10"  (1.778 m)   Wt 179 lb (81.2 kg)   BMI 25.68 kg/m   Constitutional: VS see above. General Appearance: alert, well-developed, well-nourished, NAD  Eyes: Normal lids and conjunctive, non-icteric sclera  Ears, Nose, Mouth, Throat: MMM  Neck: No masses, trachea midline. No thyroid enlargement. No tenderness/mass appreciated. No lymphadenopathy  Respiratory: Normal respiratory effort. no wheeze, no rhonchi, no rales  Cardiovascular: S1/S2 normal, no murmur, no rub/gallop auscultated. RRR. No lower extremity edema.   Musculoskeletal: Gait normal. No clubbing/cyanosis of digits.   Neurological: Normal balance/coordination. No tremor.   Skin: warm, dry, intact. No rash/ulcer.  Psychiatric: Normal judgment/insight. Normal mood and affect.  Oriented x3. No thoughts of hurting self or others. Normal thought content.      ASSESSMENT/PLAN:   Next visit plan for lipid and glucose screening  Can increase dose of medication back up to 200 mg desired, patient would like to try 100 mg to 150 mg and see if this is just as effective. Did well on 200 mg in the past.  Bipolar disorder in partial remission, most  recent episode unspecified type (HCC) - Plan: QUEtiapine (SEROQUEL) 100 MG tablet  Family history of heart attack    Patient Instructions  Plan to follow-up in 3 months for medication management and labs. Call or message me sooner if there is anything you need!       Visit summary with medication list and pertinent instructions was printed for patient to review. All questions at time of visit were answered - patient instructed to contact office with any additional concerns. ER/RTC precautions were reviewed with the patient. Follow-up plan: Return in about 3 months (around 03/28/2016) for MOOD DISORDER FOLLOWUP, SEROQUEL MANAGEMENT .

## 2016-01-04 ENCOUNTER — Telehealth: Payer: Self-pay

## 2016-01-04 DIAGNOSIS — F3170 Bipolar disorder, currently in remission, most recent episode unspecified: Secondary | ICD-10-CM

## 2016-01-04 NOTE — Telephone Encounter (Signed)
[facility] called stated that patient needs a new Rx for Seroquel because he has been taking 2 pills a day. She stated that patient do have 1 refill but his insurance will not cover it unless there is a ne script that says patient takes 2 a day. Please advise.  Please see office visit note below. Estelle Junehonda Cunningham,CMA  ASSESSMENT/PLAN:   Next visit plan for lipid and glucose screening  Can increase dose of medication back up to 200 mg desired, patient would like to try 100 mg to 150 mg and see if this is just as effective. Did well on 200 mg in the past.

## 2016-01-05 ENCOUNTER — Other Ambulatory Visit: Payer: Self-pay | Admitting: *Deleted

## 2016-01-05 DIAGNOSIS — F3170 Bipolar disorder, currently in remission, most recent episode unspecified: Secondary | ICD-10-CM

## 2016-01-05 MED ORDER — QUETIAPINE FUMARATE 200 MG PO TABS: 200.0000 mg | ORAL_TABLET | Freq: Every day | ORAL | 3 refills | Status: DC

## 2016-01-05 NOTE — Telephone Encounter (Signed)
I went ahead and sent prescription for 200 mg tablets.

## 2016-01-05 NOTE — Telephone Encounter (Signed)
Patient has been informed. Liba Hulsey,CMA  

## 2016-01-18 ENCOUNTER — Telehealth: Payer: Self-pay

## 2016-01-18 DIAGNOSIS — F3170 Bipolar disorder, currently in remission, most recent episode unspecified: Secondary | ICD-10-CM

## 2016-01-18 MED ORDER — QUETIAPINE FUMARATE 200 MG PO TABS: 200.0000 mg | ORAL_TABLET | Freq: Every day | ORAL | 0 refills | Status: DC

## 2016-01-18 NOTE — Telephone Encounter (Signed)
Patient states he takes only 1 QHS. 90 refill sent.

## 2016-01-18 NOTE — Telephone Encounter (Signed)
[facility] for a 90 day prescription for Seroquel. Is a 90 day prescription appropriate?

## 2016-01-18 NOTE — Telephone Encounter (Signed)
I think that's fine. Would confirm dose with patient, I think he was back and forth on whether he preferred 100 mg or 200 mg dose

## 2016-01-22 ENCOUNTER — Ambulatory Visit: Payer: Self-pay | Admitting: Osteopathic Medicine

## 2016-02-22 ENCOUNTER — Ambulatory Visit: Payer: BLUE CROSS/BLUE SHIELD | Admitting: Licensed Clinical Social Worker

## 2016-03-27 ENCOUNTER — Ambulatory Visit: Payer: Self-pay | Admitting: Osteopathic Medicine

## 2016-04-05 ENCOUNTER — Ambulatory Visit: Payer: BLUE CROSS/BLUE SHIELD | Admitting: Family Medicine

## 2016-06-14 ENCOUNTER — Ambulatory Visit: Payer: Self-pay | Admitting: Family Medicine

## 2016-10-30 ENCOUNTER — Ambulatory Visit (INDEPENDENT_AMBULATORY_CARE_PROVIDER_SITE_OTHER): Payer: BLUE CROSS/BLUE SHIELD | Admitting: Osteopathic Medicine

## 2016-10-30 ENCOUNTER — Encounter: Payer: Self-pay | Admitting: Osteopathic Medicine

## 2016-10-30 VITALS — BP 144/84 | HR 106 | Ht 71.0 in | Wt 201.0 lb

## 2016-10-30 DIAGNOSIS — F1120 Opioid dependence, uncomplicated: Secondary | ICD-10-CM

## 2016-10-30 DIAGNOSIS — F1121 Opioid dependence, in remission: Secondary | ICD-10-CM | POA: Diagnosis not present

## 2016-10-30 DIAGNOSIS — F3170 Bipolar disorder, currently in remission, most recent episode unspecified: Secondary | ICD-10-CM

## 2016-10-30 NOTE — Progress Notes (Signed)
HPI: Clayton Stafford is a 26 y.o. male  who presents to [facility] today, 10/30/16,  for chief complaint of:  Chief Complaint  Patient presents with  . Other    Discuss medications    Patient seen once last year and lost to follow-up after we restarted his Seroquel. He has been bouncing around between here and [place] and [place] with drug abuse and rehab cycle. He is here today last done heroin 2 days ago and is looking for referral to addiction treatment.   Currently, no tremors or diarrhea, some mild anxiety and sweating.    Notes from rehab/psych in [place]:  Last visit 05/21/16 "Outpatient Prescriptions Marked as Taking for the 05/21/16 encounter (Office Visit) with Dedra Skeens, MD  Medication Sig Dispense Refill  . gabapentin (NEURONTIN) 300mg  Cap Take 1 Cap by mouth three times daily 270 Cap 0  . OXcarbazepine (TRILEPTAL) 150mg  Tab Take 1 Tab by mouth twice daily 60 Tab 1  . QUEtiapine (SEROQUEL) 200mg  Tab Take 1 Tab by mouth daily 90 Tab 0  ... ASSESSMENT and PLAN F11.20 Uncomplicated opioid dependence (HCC) (primary encounter diagnosis) F31.9 Bipolar 1 disorder (HCC) F32.9 Depression, unspecified depression type F41.9 Anxiety Patient Active Problem List: Bipolar 1 disorder (HCC) Anxiety Depression ... PLAN Procedures: No new orders ordered on this visit ... Medications:  Care Plan:  Patient is here for routine visit currently living in an SLE and attending IOP program at [facility] recovery. Patient claimed that he's been sober and not using any drugs mentally much more stable since he's been taking his medications. Lab work is available which is discussed with the patient's patient liver enzymes mildly elevated. At this time no changes medication refill for gabapentin is provided to him again. Patient to follow up in 4-6 weeks' time medical condition is to continue with SLE and continue with current IOP program. Follow-up in one month. Dedra Skeens, MD"  08/16/16 treated in ER in [facility] Pt walked in thru triage for medical clearance for rehab for drug addiction. Pt states he has been in detox and has not used drug in 2 weeks.   MD note:  Pt comes from Rehab for evaluation, caretaker states that patient has arrived at Rehab taken new meds of Suboxone, klonopin and neurontin. Pt had become mildly lethargic. Meds given by facility MD whom has not re-evaluated pts response to meds and pt was transferred for "medical clearance". Pt denies any complaints states he recently started meds. Denies any fever no chills no H/A, no SOB or CP. Denies any other assoc complaints. ... Drugs at time of discharge Suboxone, Klonopin, Gabapentin     Past medical, surgical, social and family history reviewed: Patient Active Problem List   Diagnosis Date Noted  . Bipolar disorder in partial remission (HCC) 12/27/2015  . Family history of heart attack 12/27/2015  . Substance induced mood disorder (HCC) 09/05/2014  . Polysubstance dependence (HCC) 09/05/2014   No past surgical history on file. Social History  Substance Use Topics  . Smoking status: Current Every Day Smoker    Packs/day: 2.00    Types: Cigarettes  . Smokeless tobacco: Never Used  . Alcohol use 1.2 oz/week    2 Cans of beer per week     Comment: weekends   Family History  Problem Relation Age of Onset  . Depression Father   . Heart attack Father      Current medication list and allergy/intolerance information reviewed:   Current  Outpatient Prescriptions  Medication Sig Dispense Refill  . QUEtiapine (SEROQUEL) 200 MG tablet Take 1 tablet (200 mg total) by mouth at bedtime. Increase to 1.5 tablet (150 mg) if needed 90 tablet 0   No current facility-administered medications for this visit.    No Known Allergies    Review of Systems:  Constitutional:  No  fever, no chills, No recent illness, No unintentional weight changes.   HEENT: No  headache, no vision  change,  Cardiac: No  chest pain, No  pressure, No palpitations   Respiratory:  No  shortness of breath.  Gastrointestinal: No  abdominal pain, No  nausea, No  vomiting,  No  blood in stool, No  diarrhea, No  constipation    Musculoskeletal: No new myalgia/arthralgia  Skin: No  Rash, +sweating  Neurologic: No  weakness, No  dizziness  Psychiatric: No  concerns with depression, +concerns with anxiety, No sleep problems, No mood problems  Exam:  BP (!) 144/84   Pulse (!) 106   Ht 5\' 11"  (1.803 m)   Wt 201 lb (91.2 kg)   BMI 28.03 kg/m   Constitutional: VS see above. General Appearance: alert, well-developed, well-nourished, NAD  Eyes: Normal lids and conjunctive, non-icteric sclera  Ears, Nose, Mouth, Throat: MMM, Normal external inspection ears/nares/mouth/lips/gums.   Neck: No masses, trachea midline.   Respiratory: Normal respiratory effort. no wheeze, no rhonchi, no rales  Cardiovascular: S1/S2 normal, no murmur, no rub/gallop auscultated. RRR.   Musculoskeletal: Gait normal. Symmetric and independent movement of all extremities   Neurological: Normal balance/coordination. No tremor. No cranial nerve deficit on limited exam.   Skin: warm, dry, intact. No rash/ulcer.   Psychiatric: Normal judgment/insight. Normal mood and affect. Oriented x3.     ASSESSMENT/PLAN:   Patient had to leave, states his mom was waiting and he is only here for referral. We did not repeat BP/HR before he left. He is not suicidal. He states he will go to ER if symptoms worse, I am ok to call in Gabapentin and Clonidine to help symptoms, advised Imodium if diarrhea, see ER if worse.   He is alert and oriented, does not appear intoxicated, at this point I don't think a TDO is in order and he is under his mom's supervision.   Opioid dependence in remission (HCC) - Plan: Ambulatory referral to Behavioral Health  Bipolar disorder in partial remission, most recent episode unspecified type  (HCC) - Plan: Ambulatory referral to Behavioral Health  Heroin dependence (HCC) - Plan: Ambulatory referral to Behavioral Health      Visit summary with medication list and pertinent instructions was printed for patient to review. All questions at time of visit were answered - patient instructed to contact office with any additional concerns. ER/RTC precautions were reviewed with the patient. Follow-up plan: Return for recheck next day or so if not able to get into behavioral health - needs routine physical .  Note: Total time spent 25 minutes, greater than 50% of the visit was spent face-to-face counseling and coordinating care for the following: The primary encounter diagnosis was Opioid dependence in remission (HCC). Diagnoses of Bipolar disorder in partial remission, most recent episode unspecified type (HCC) and Heroin dependence (HCC) were also pertinent to this visit.Marland Kitchen

## 2016-10-31 ENCOUNTER — Telehealth (HOSPITAL_COMMUNITY): Payer: Self-pay

## 2017-01-21 ENCOUNTER — Ambulatory Visit: Payer: Self-pay | Admitting: Osteopathic Medicine

## 2017-01-21 ENCOUNTER — Ambulatory Visit: Payer: BLUE CROSS/BLUE SHIELD | Admitting: Osteopathic Medicine

## 2017-01-21 ENCOUNTER — Telehealth: Payer: Self-pay | Admitting: Osteopathic Medicine

## 2017-01-21 ENCOUNTER — Encounter: Payer: Self-pay | Admitting: Osteopathic Medicine

## 2017-01-21 VITALS — BP 107/69 | HR 90 | Temp 97.5°F | Resp 16 | Wt 199.3 lb

## 2017-01-21 DIAGNOSIS — F3170 Bipolar disorder, currently in remission, most recent episode unspecified: Secondary | ICD-10-CM

## 2017-01-21 DIAGNOSIS — Z0189 Encounter for other specified special examinations: Secondary | ICD-10-CM

## 2017-01-21 DIAGNOSIS — F1921 Other psychoactive substance dependence, in remission: Secondary | ICD-10-CM | POA: Diagnosis not present

## 2017-01-21 MED ORDER — OXCARBAZEPINE 150 MG PO TABS: 450.0000 mg | ORAL_TABLET | Freq: Every day | ORAL | 1 refills | Status: DC

## 2017-01-21 MED ORDER — QUETIAPINE FUMARATE 200 MG PO TABS: 200.0000 mg | ORAL_TABLET | Freq: Every day | ORAL | 1 refills | Status: DC

## 2017-01-21 MED ORDER — CLONIDINE HCL 0.1 MG PO TABS: 0.1000 mg | ORAL_TABLET | Freq: Three times a day (TID) | ORAL | 1 refills | Status: DC

## 2017-01-21 NOTE — Telephone Encounter (Signed)
Pt's mother called to request refills on all Rx's be 90 days. Want them sent to local on file. Will route to Provider for review. Mothers phone number: [phone]

## 2017-01-21 NOTE — Patient Instructions (Addendum)
Plan:  I will refill the Seroquel, Trileptal and Clonidine for one month with limited refills, I will need to review the records from [place] from Dr Lyndel PleasureKauffmann at [facility] to see what their plan was.   I am NOT A PSYCHIATRIST and your condition really needs to be monitored by a specialist in case you reach a point where you are not doing well. I'm fine to refill the medications for now, but depending on the records I get from Dr. Soyla MurphyKauffman, I may ask you to see a psychiatrist locally for further refills if I think this is in the best interest of your mental health.

## 2017-01-21 NOTE — Progress Notes (Signed)
HPI: Clayton Stafford is a 26 y.o. male who  has a past medical history of Bipolar 1 disorder (HCC).  he presents to [facility] today, 01/21/17,  for chief complaint of:  Chief Complaint  Patient presents with  . Follow-up    Mood; recovery    Patient last seen by me 10/30/2016. At that point, was here for medication refills and referral to addiction treatment, he had last done heroin 2 days prior to that visit. Currently no tremors or diarrhea, some mild anxiety and sweating.  Patient has been back and forth between [place] in [place] also [place] Most recently in [place]. He brings a medication list from a facility that I presume is a rehabilitation facility the patient describes it as a "clean house" and he denies being admitted there for a relapse or overdose or other treatment, he states that it was voluntary, you can sign in and out and there is therapy available. His medication list mentions dosages of medicines but not frequency. Includes Seroquel, Trileptal, Requip, Clonidine, Keppra. Admitted 12/25/16 and discharged 01/08/17 - unclear whether voluntary admission or left AGAINST MEDICAL ADVICE or what the real story is.  Patient states he only medications he is taking at this time are as follows: Trileptal 2 PM 1 AM Seroquel 1 PM Clonidine 2-3 times per day   He is very reluctant to establish with a psychiatrist here. He insists that he has been clean and just needs to get back on his medications.    Past medical, surgical, social and family history reviewed:  Patient Active Problem List   Diagnosis Date Noted  . Bipolar disorder in partial remission (HCC) 12/27/2015  . Family history of heart attack 12/27/2015  . Substance induced mood disorder (HCC) 09/05/2014  . Polysubstance dependence (HCC) 09/05/2014    No past surgical history on file.  Social History   Tobacco Use  . Smoking status: Current Every  Day Smoker    Packs/day: 2.00    Types: Cigarettes  . Smokeless tobacco: Never Used  Substance Use Topics  . Alcohol use: Yes    Alcohol/week: 1.2 oz    Types: 2 Cans of beer per week    Comment: weekends    Family History  Problem Relation Age of Onset  . Depression Father   . Heart attack Father      Current medication list and allergy/intolerance information reviewed:    Current Outpatient Medications  Medication Sig Dispense Refill  . cloNIDine (CATAPRES) 0.1 MG tablet Take 1 tablet (0.1 mg total) by mouth 3 (three) times daily. 60 tablet 1  . OXcarbazepine (TRILEPTAL) 150 MG tablet Take 3 tablets (450 mg total) by mouth daily. 1 tablet in AM and 2 tablets in PM 90 tablet 1  . QUEtiapine (SEROQUEL) 200 MG tablet Take 1 tablet (200 mg total) by mouth at bedtime. Increase to 1.5 tablet (150 mg) if needed 30 tablet 1   No current facility-administered medications for this visit.     No Known Allergies    Review of Systems:  Constitutional:  No  fever, no chills, No recent illness,   HEENT: No  headache, no vision change  Cardiac: No  chest pain, No  pressure, No palpitations,   Respiratory:  No  shortness of breath  Musculoskeletal: No new myalgia/arthralgia  Neurologic: No  weakness, No  dizziness,  Psychiatric: No  concerns with depression, No  concerns with anxiety, +sleep problems, +mood problems  Exam:  BP 107/69 (BP Location: Left Arm, Patient Position: Sitting, Cuff Size: Large)   Pulse 90   Temp (!) 97.5 F (36.4 C) (Oral)   Resp 16   Wt 199 lb 4.8 oz (90.4 kg)   SpO2 96%   BMI 27.80 kg/m   Constitutional: VS see above. General Appearance: alert, well-developed, well-nourished, NAD.   Eyes: Normal lids and conjunctive, non-icteric sclera  Ears, Nose, Mouth, Throat: MMM, Normal external inspection ears/nares/mouth/lips/gums.   Neck: No masses, trachea midline.   Respiratory: Normal respiratory effort. no wheeze, no rhonchi, no  rales  Cardiovascular: S1/S2 normal, no murmur, no rub/gallop auscultated. RRR.   Musculoskeletal: Gait normal.  Neurological: Normal balance/coordination. No tremor.    Psychiatric: Poor judgment/insight. Impatient mood and affect. Oriented x3. No thought disorder. No SI/HI.    ASSESSMENT/PLAN:   Bipolar disorder in partial remission, most recent episode unspecified type (HCC) - Plan: cloNIDine (CATAPRES) 0.1 MG tablet, OXcarbazepine (TRILEPTAL) 150 MG tablet, QUEtiapine (SEROQUEL) 200 MG tablet  History of drug dependence (HCC)    Patient Instructions  Plan:  I will refill the Seroquel, Trileptal and Clonidine for one month with limited refills, I will need to review the records from [place] from Dr Lyndel PleasureKauffmann at [facility] to see what their plan was.   I am NOT A PSYCHIATRIST and your condition really needs to be monitored by a specialist in case you reach a point where you are not doing well. I'm fine to refill the medications for now, but depending on the records I get from Dr. Soyla MurphyKauffman, I may ask you to see a psychiatrist locally for further refills if I think this is in the best interest of your mental health.     ER/RTC precautions were reviewed with the patient. He is not particularly forthcoming and it is difficult to get in on his history from him or a clear story of where he has been in what he has been up to since our last visit. Will await records from his most recent stay and what I assume is a rehabilitation facility in [place]. He declines psychiatric referral at this point, states he just needs to get back on the Seroquel, Trileptal, and clonidine. We'll refill these medications in good faith at this point but patient is advised and reviewed with his written discharge instructions that I will not continue to fill these medications for him without a psychiatric referral if I such a course of action is appropriate  Visit summary with medication list and  pertinent instructions was printed for patient to review. All questions at time of visit were answered - patient instructed to contact office with any additional concerns. ER/RTC precautions were reviewed with the patient. Follow-up plan: Return in about 4 weeks (around 02/18/2017) for or depending on record review to continue psychiatric medications .  Note: Total time spent 25 minutes, greater than 50% of the visit was spent face-to-face counseling and coordinating care for the following: The primary encounter diagnosis was Bipolar disorder in partial remission, most recent episode unspecified type (HCC). A diagnosis of History of drug dependence (HCC) was also pertinent to this visit.Marland Kitchen.  Please note: voice recognition software was used to produce this document, and typos may escape review. Please contact Dr. Lyn HollingsheadAlexander for any needed clarifications.

## 2017-01-22 MED ORDER — CLONIDINE HCL 0.1 MG PO TABS: 0.1000 mg | ORAL_TABLET | Freq: Three times a day (TID) | ORAL | 0 refills | Status: DC

## 2017-01-22 MED ORDER — QUETIAPINE FUMARATE 200 MG PO TABS: 200.0000 mg | ORAL_TABLET | Freq: Every day | ORAL | 0 refills | Status: DC

## 2017-01-22 MED ORDER — OXCARBAZEPINE 150 MG PO TABS: 450.0000 mg | ORAL_TABLET | Freq: Every day | ORAL | 0 refills | Status: DC

## 2017-01-22 NOTE — Telephone Encounter (Signed)
I called and spoke with the pharmacy to verify- they validated that insurance will ONLY pay for 90 day supply. If they want a 30 day, it would have to be paid cash price - not ran through insurance.

## 2017-01-22 NOTE — Telephone Encounter (Signed)
Left PCP recommendation on Pt's mothers VM and on Pt's VM advising of follow up in one month. Callback provided to discuss referral. Refills sent.

## 2017-01-22 NOTE — Telephone Encounter (Signed)
Fine, can send 90 day supply with 0 refills. Would still explain to the patient that no additional refills will be authorized without him coming to see me for 1 month follow-up for recheck, and I still strongly recommend establishing with a psychiatrist.

## 2017-01-22 NOTE — Telephone Encounter (Signed)
No - 30 days only and he needs to check back in with me. I recommended he establish with a psychiatrist and he disagreed with my professional advise, so until he does this I will want to be checking in with him in a month to confirm he is stable and I have a chance to review records.   If displeased with this plan, I am happy to place a referral to psychiatry

## 2017-02-18 ENCOUNTER — Ambulatory Visit: Payer: Self-pay | Admitting: Osteopathic Medicine

## 2017-02-18 ENCOUNTER — Telehealth: Payer: Self-pay | Admitting: Osteopathic Medicine

## 2017-02-18 DIAGNOSIS — Z0189 Encounter for other specified special examinations: Secondary | ICD-10-CM

## 2017-02-18 NOTE — Telephone Encounter (Signed)
Please call patient: he did not show to his appointment today. This is his second no-show in the past year. Please call to make sure everything is ok and see if he would like to reschedule. Please remind him of clinic policy that 3 no-shows in 1 year will result in dismissal from the practice, to protect access to appointments for all our patients.    NO SHOW 01/21/17 for f/u w/ Dr Lyn HollingsheadAlexander NO SHOW 02/18/17 for f/u w/ Dr Lyn HollingsheadAlexander

## 2017-04-15 ENCOUNTER — Telehealth: Payer: Self-pay

## 2017-04-15 DIAGNOSIS — F3170 Bipolar disorder, currently in remission, most recent episode unspecified: Secondary | ICD-10-CM

## 2017-04-15 DIAGNOSIS — F1920 Other psychoactive substance dependence, uncomplicated: Secondary | ICD-10-CM

## 2017-04-15 MED ORDER — QUETIAPINE FUMARATE 200 MG PO TABS: 200.0000 mg | ORAL_TABLET | Freq: Every day | ORAL | 0 refills | Status: DC

## 2017-04-15 NOTE — Telephone Encounter (Signed)
Left a brief voicemail for patient to return call back to office regarding provider's note & medication refill.

## 2017-04-15 NOTE — Telephone Encounter (Signed)
Pharmacy requesting refills quetiapine & clonidine. Pls advise, if appropriate. Thanks.

## 2017-04-15 NOTE — Telephone Encounter (Signed)
He no-showed last appointment He should not need clonidine any more - this is for opiate withdrawal Will send refills Seroquel but this will be the last ones from me unless he follows up with psychiatry - I've placed a referral

## 2017-04-18 ENCOUNTER — Other Ambulatory Visit: Payer: Self-pay

## 2017-04-18 DIAGNOSIS — F3170 Bipolar disorder, currently in remission, most recent episode unspecified: Secondary | ICD-10-CM

## 2017-04-21 ENCOUNTER — Telehealth: Payer: Self-pay

## 2017-04-21 NOTE — Telephone Encounter (Signed)
[facility] @ [phone] requesting medication refill for oxcarbazepine. Pls advise if appropriate. Thanks.

## 2017-04-22 MED ORDER — OXCARBAZEPINE 150 MG PO TABS: 450.0000 mg | ORAL_TABLET | Freq: Every day | ORAL | 0 refills | Status: DC

## 2017-04-22 NOTE — Progress Notes (Signed)
Attempted to contact Pt, went straight to VM. Left message with PCP recommendation, callback info provided.

## 2017-04-22 NOTE — Progress Notes (Unsigned)
Refilled once but he has been noncompliant with follow-up and with psych referral - no additional refills unless seen by me or agrees to psychiatric referral

## 2017-05-30 ENCOUNTER — Ambulatory Visit: Payer: BLUE CROSS/BLUE SHIELD | Admitting: Medical

## 2017-05-30 ENCOUNTER — Telehealth: Payer: Self-pay | Admitting: Medical

## 2017-05-30 ENCOUNTER — Encounter: Payer: Self-pay | Admitting: Medical

## 2017-05-30 VITALS — BP 116/54 | HR 64 | Temp 97.8°F | Resp 16 | Ht 69.0 in | Wt 203.0 lb

## 2017-05-30 DIAGNOSIS — F3170 Bipolar disorder, currently in remission, most recent episode unspecified: Secondary | ICD-10-CM

## 2017-05-30 DIAGNOSIS — F1920 Other psychoactive substance dependence, uncomplicated: Secondary | ICD-10-CM | POA: Diagnosis not present

## 2017-05-30 DIAGNOSIS — F909 Attention-deficit hyperactivity disorder, unspecified type: Secondary | ICD-10-CM | POA: Diagnosis not present

## 2017-05-30 DIAGNOSIS — F1121 Opioid dependence, in remission: Secondary | ICD-10-CM

## 2017-05-30 MED ORDER — ATOMOXETINE HCL 25 MG PO CAPS: 25.0000 mg | ORAL_CAPSULE | Freq: Every day | ORAL | 0 refills | Status: DC

## 2017-05-30 NOTE — Telephone Encounter (Signed)
Dr. Abner GreenspanBlyth,  This is a new patient to the practice.  You see both mother and father patient.  Apparently dad has been sending you messages regarding Cristal DeerChristopher.  He has a history of bipolar, ADHD, polysubstance abuse and PTSD.  I sent you the note on him today.  He is currently in recovery program and has been clean for 70 days.  He has stopped the Vyvanse as note explained.  He now wants to start Strattera as replacement.  This does seem to make sense/ reasonable option.  However due to complex psychiatric history and bipolar diagnoses I just wanted to run this by you and make sure you agree.  Also mother has that you give father an update on the plan going forward.  In light of his diagnoses I was wondering if you could get him in with you relatively quickly in the near future/couple weeks possibly?  Thanks,  Ramon DredgeEdward

## 2017-05-30 NOTE — Telephone Encounter (Signed)
Yes Strattera makes sense start at 20 mg daily and warn him it can take awhile to feel the affect. I will find somewhere to see him in the next 2-4 weeks. Also suggest he see [facility] as well to follow along if he does not have a counselor or a psychiatrist. Thanks for getting him set up in the clinic.

## 2017-05-30 NOTE — Addendum Note (Signed)
Addended by: Gwenevere AbbotSAGUIER, Jaydian Santana M on: 05/30/2017 04:57 PM   Modules accepted: Orders

## 2017-05-30 NOTE — Progress Notes (Signed)
Subjective:    Patient ID: Clayton Stafford, male    DOB: 09/20/1990, 27 y.o.   MRN: [id number]  HPI  Pt in for first time.   Pt does work at [employer] and working in [employer].  Exercises. He works out every day. 1 cup of coffee a day. States healthy diet. Single.  Pt has history of bipolar disorder.(Pt takes bipolar meds. He admits that his mood is up and down).  Pt states he feels not focused(diagnosed with ADD as youth.  He states at 27 years of age). Pt used to be on vyvanse but he is in a recovery group. He has history of polysubstance abuse. Pt states this time he is clean for 70 days. [facility]. In [place] recovery program allowed him to be on vyvanse but [facility] has indicated they prefer that he not be on Vyvanse.  In addition patient does not want to be on Vyvanse anymore.  Pt has been in and out of recover program on and off for 10 years.  Pt wants to consider him using strattera.  Pt medications have been prescribed by psychiatrist.   Pt most recently did very well at [facility].  Pt has some likely ptsd as well. Incarcerated in past.  Review of Systems  Constitutional: Negative for chills, fatigue and fever.  HENT: Negative for congestion, ear discharge and facial swelling.   Respiratory: Negative for apnea, cough, choking, shortness of breath and wheezing.   Cardiovascular: Negative for chest pain and palpitations.  Gastrointestinal: Negative for abdominal pain.  Musculoskeletal: Negative for arthralgias and gait problem.  Neurological: Negative for dizziness, seizures, weakness and headaches.  Hematological: Negative for adenopathy. Does not bruise/bleed easily.  Psychiatric/Behavioral: Negative for behavioral problems, confusion, dysphoric mood, sleep disturbance and suicidal ideas. The patient is not nervous/anxious.        Patient's mood appears stable presently.     Past Medical History:  Diagnosis  Date  . ADHD   . Anxiety   . Bipolar 1 disorder (HCC)   . Bipolar 1 disorder (HCC)   . Depression      Social History   Socioeconomic History  . Marital status: Single    Spouse name: Not on file  . Number of children: Not on file  . Years of education: Not on file  . Highest education level: Not on file  Occupational History  . Occupation: Geophysicist/field seismologistAssistant at [employer]  Social Needs  . Financial resource strain: Not hard at all  . Food insecurity:    Worry: Never true    Inability: Never true  . Transportation needs:    Medical: No    Non-medical: No  Tobacco Use  . Smoking status: Current Every Day Smoker    Packs/day: 2.00    Types: Cigarettes  . Smokeless tobacco: Never Used  Substance and Sexual Activity  . Alcohol use: Yes    Alcohol/week: 1.2 oz    Types: 2 Cans of beer per week    Comment: weekends  . Drug use: Yes    Types: IV, "Crack" cocaine, Cocaine, Heroin, Methamphetamines, Marijuana, LSD    Comment: patient in recovery at this time  . Sexual activity: Yes  Lifestyle  . Physical activity:    Days per week: 7 days    Minutes per session: 60 min  . Stress: Not on file  Relationships  . Social connections:    Talks on phone: Not on file  Gets together: Not on file    Attends religious service: Not on file    Active member of club or organization: Not on file    Attends meetings of clubs or organizations: Not on file    Relationship status: Not on file  . Intimate partner violence:    Fear of current or ex partner: Not on file    Emotionally abused: Not on file    Physically abused: Not on file    Forced sexual activity: Not on file  Other Topics Concern  . Not on file  Social History Narrative  . Not on file    History reviewed. No pertinent surgical history.  Family History  Problem Relation Age of Onset  . Depression Father   . Heart attack Father   . Hypertension Mother   . Bladder Cancer Paternal Grandfather     No Known  Allergies  Current Outpatient Medications on File Prior to Visit  Medication Sig Dispense Refill  . cloNIDine (CATAPRES) 0.1 MG tablet Take 1 tablet (0.1 mg total) by mouth 3 (three) times daily. Must follow up in one month. 270 tablet 0  . gabapentin (NEURONTIN) 300 MG capsule Take 300 mg by mouth 3 (three) times daily.  2  . OXcarbazepine (TRILEPTAL) 150 MG tablet Take 3 tablets (450 mg total) by mouth daily. 1 tablet in AM and 2 tablets in PM. Must follow up in one month. 270 tablet 0  . QUEtiapine (SEROQUEL) 200 MG tablet Take 1 tablet (200 mg total) by mouth at bedtime. Increase to 1.5 tablet (150 mg) if needed. Must follow up in one month. 90 tablet 0   No current facility-administered medications on file prior to visit.     BP (!) 116/54 (BP Location: Left Arm, Patient Position: Sitting, Cuff Size: Large)   Pulse 64   Temp 97.8 F (36.6 C) (Oral)   Resp 16   Ht 5\' 9"  (1.753 m)   Wt 203 lb (92.1 kg)   SpO2 100%   BMI 29.98 kg/m       Objective:   Physical Exam  General Mental Status- Alert. General Appearance- Not in acute distress.   Skin General: Color- Normal Color. Moisture- Normal Moisture.  Neck Carotid Arteries- Normal color. Moisture- Normal Moisture. No carotid bruits. No JVD.  Chest and Lung Exam Auscultation: Breath Sounds:-Normal.  Cardiovascular Auscultation:Rythm- Regular. Murmurs & Other Heart Sounds:Auscultation of the heart reveals- No Murmurs.  Abdomen Inspection:-Inspeection Normal. Palpation/Percussion:Note:No mass. Palpation and Percussion of the abdomen reveal- Non Tender, Non Distended + BS, no rebound or guarding.    Neurologic Cranial Nerve exam:- CN III-XII intact(No nystagmus), symmetric smile. Strength:- 5/5 equal and symmetric strength both upper and lower extremities.      Assessment & Plan:  For your history of substance abuse dependence, continue to attend program at [facility].  Good job on staying clean for the last  70 days.  History of bipolar, you do appear stable presently and recommend you stay on all current medications.  For history of ADHD, Strattera likely is the best option for you.  I will send Dr. Raylene Miyamoto message today reminding her of the conversation with your husband regarding Kalev's duration and medical history.  I think at the latest I will get an answer regarding possibly prescribing Strattera by Monday.  If I get an answer sooner than we will try to call you.  If you have any worsening signs or symptoms regarding mood please update test.  Follow-up with  Dr. Abner Greenspan.  When I send message to Dr. Abner Greenspan today we will see if she can get you in relatively quickly.  Esperanza Richters, PA-C

## 2017-05-30 NOTE — Telephone Encounter (Signed)
Open to review, document, and send in Strattera to patient's pharmacy.

## 2017-05-30 NOTE — Patient Instructions (Addendum)
For your history of substance abuse dependence, continue to attend program [facility].  Good job on staying clean for the last 70 days.  History of bipolar, you do appear stable presently and recommend you stay on all current medications.  For history of ADHD, Strattera likely is the best option for you.  I will send Dr. Abner GreenspanBlyth message today reminding her of the conversation with your husband regarding Jimie's duration and medical history.  I think at the latest I will get an answer regarding possibly prescribing Strattera by Monday.  If I get an answer sooner than we will try to call you.  If you have any worsening signs or symptoms regarding mood please update test.  Follow-up with Dr. Abner GreenspanBlyth.  When I send message to Dr. Abner GreenspanBlyth today we will see if she can get you in relatively quickly.   I did get reply back from Dr. Abner GreenspanBlyth and [facility].  I wrote for Strattera 25 mg daily.  Sent Rx to patient's pharmacy  And notified patient that Rx was sent.  Also advised for him to go ahead and call and ask for appointment with Dr. Abner GreenspanBlyth in 2-4 weeks.  Reviewed with patient that he is already seeing a counselor and psychiatrist presently.

## 2017-06-27 ENCOUNTER — Other Ambulatory Visit: Payer: Self-pay

## 2017-07-01 ENCOUNTER — Telehealth: Payer: Self-pay | Admitting: Medical

## 2017-07-01 ENCOUNTER — Other Ambulatory Visit: Payer: Self-pay | Admitting: Medical

## 2017-07-01 NOTE — Telephone Encounter (Signed)
LOV  05/30/17 Mr. Saguier Last refilled 05/30/17   # 30  With 0 refills

## 2017-07-01 NOTE — Telephone Encounter (Signed)
There was a Narx report on counter which Clayton Stafford printed but I did not see. Will you look for that. If you can't find then will you reprint. Attempting to prescribe straterra. Clayton Stafford pulled report and asked me to review.

## 2017-07-01 NOTE — Telephone Encounter (Signed)
Last strattera RX: 05/30/17, #30 Last OV: 05/30/17 Next OV: not scheduled. Was advised 2-4 wks at last OV. UDS: not on file CSC: not on file CSR: Please see NARX report on PCP's ledge at station.

## 2017-07-01 NOTE — Telephone Encounter (Signed)
Copied from CRM [id number]. Topic: Quick Communication - Rx Refill/Question >> Jul 01, 2017 10:34 AM Mickel Baas B, NT wrote: Medication: atomoxetine (STRATTERA) 25 MG capsule Has the patient contacted their pharmacy? Yes.   (Agent: If no, request that the patient contact the pharmacy for the refill.) Preferred Pharmacy (with phone number or street name): [id number] - [place], [place] - [address] AT [address] Agent: Please be advised that RX refills may take up to 3 business days. We ask that you follow-up with your pharmacy.  Completely out of medication

## 2017-07-02 MED ORDER — ATOMOXETINE HCL 25 MG PO CAPS: 25.0000 mg | ORAL_CAPSULE | Freq: Every day | ORAL | 0 refills | Status: DC

## 2017-07-02 NOTE — Telephone Encounter (Signed)
Reports printed

## 2017-07-02 NOTE — Telephone Encounter (Signed)
Did review narx report. Refilled medication. Pt actually is not my patient. I saw him since Dr. Abner Greenspan was in office. Pt parents pt of Dr. Abner Greenspan. He was supposed to follow up with Dr. Abner Greenspan if I remember correctly. He needs office visit either way within a month.   Can you get clarification on who will be his pcp. I think my name is on his chart maybe since he has not seen Dr. Abner Greenspan yet. If so schedule him appointment with Dr. Abner Greenspan.

## 2017-07-09 NOTE — Telephone Encounter (Signed)
Attempted to reach pt and left message to return my call. When pt calls back we need to verify if Dr Abner Greenspan is going to be his PCP and if so, he needs to schedule a 30 minute follow up with Dr Abner Greenspan as soon as possible as he has not established with her yet. Saw PA, Saguier in March and was advised follow up with Dr Abner Greenspan in 2-4 weeks.

## 2017-07-21 NOTE — Telephone Encounter (Signed)
OK to give a 30 day supply of strattera since he has been seen in the office

## 2017-07-21 NOTE — Telephone Encounter (Signed)
Author phoned pt. To schedule an appointment with Dr. Abner Greenspan to establish care (see Esperanza Richters, PA note). Pt. Stated he could do 6/17 at 4PM, but appeared distant, distracted. When asked if his Blase Mess was working OK for him, pt. Stated "oh yeah". Dr. Abner Greenspan to follow up at office visit.

## 2017-07-29 ENCOUNTER — Other Ambulatory Visit: Payer: Self-pay | Admitting: Medical

## 2017-07-29 MED ORDER — ATOMOXETINE HCL 25 MG PO CAPS: 25.0000 mg | ORAL_CAPSULE | Freq: Every day | ORAL | 0 refills | Status: DC

## 2017-07-29 NOTE — Addendum Note (Signed)
Addended by: Crissie Sickles A on: 07/29/2017 08:52 AM   Modules accepted: Orders

## 2017-07-29 NOTE — Telephone Encounter (Signed)
Pt needs follow up appointment please call and schedule appointment.

## 2017-07-29 NOTE — Telephone Encounter (Signed)
Dr. Abner Greenspan are you taking this patient on as a new patient?   If patient is switching providers in the office please allow provider to approve before we switch providers. Patient will need to have a 30 minute transfer of care visit if approved by provider. In the meantime patient can follow up with Ramon Dredge until appointment when approved by Dr. Abner Greenspan.

## 2017-07-29 NOTE — Telephone Encounter (Signed)
I sent in medication for patient.

## 2017-07-29 NOTE — Telephone Encounter (Signed)
thanks

## 2017-07-29 NOTE — Telephone Encounter (Signed)
Patient PCP is Esperanza Richters -   Dr. Abner Greenspan will you approve a Transfer of Care appointment?

## 2017-08-04 NOTE — Telephone Encounter (Signed)
Patient has a appointment with Abner GreenspanBlyth already this month

## 2017-08-18 ENCOUNTER — Ambulatory Visit: Payer: BLUE CROSS/BLUE SHIELD | Admitting: Family Medicine

## 2017-08-20 ENCOUNTER — Encounter: Payer: Self-pay | Admitting: Medical

## 2017-08-20 ENCOUNTER — Encounter: Payer: Self-pay | Admitting: Family Medicine

## 2017-11-06 ENCOUNTER — Ambulatory Visit (INDEPENDENT_AMBULATORY_CARE_PROVIDER_SITE_OTHER): Payer: BLUE CROSS/BLUE SHIELD | Admitting: Medical

## 2017-11-06 ENCOUNTER — Encounter: Payer: Self-pay | Admitting: Medical

## 2017-11-06 ENCOUNTER — Telehealth: Payer: Self-pay | Admitting: Medical

## 2017-11-06 VITALS — BP 147/90 | HR 89 | Temp 98.2°F | Resp 16 | Ht 69.0 in | Wt 201.6 lb

## 2017-11-06 DIAGNOSIS — M79604 Pain in right leg: Secondary | ICD-10-CM | POA: Diagnosis not present

## 2017-11-06 DIAGNOSIS — T796XXD Traumatic ischemia of muscle, subsequent encounter: Secondary | ICD-10-CM

## 2017-11-06 DIAGNOSIS — F1911 Other psychoactive substance abuse, in remission: Secondary | ICD-10-CM

## 2017-11-06 DIAGNOSIS — M25561 Pain in right knee: Secondary | ICD-10-CM

## 2017-11-06 DIAGNOSIS — M79609 Pain in unspecified limb: Secondary | ICD-10-CM

## 2017-11-06 DIAGNOSIS — Z87448 Personal history of other diseases of urinary system: Secondary | ICD-10-CM

## 2017-11-06 DIAGNOSIS — Z87898 Personal history of other specified conditions: Secondary | ICD-10-CM

## 2017-11-06 DIAGNOSIS — M898X6 Other specified disorders of bone, lower leg: Secondary | ICD-10-CM | POA: Diagnosis not present

## 2017-11-06 DIAGNOSIS — M25571 Pain in right ankle and joints of right foot: Secondary | ICD-10-CM

## 2017-11-06 LAB — POC URINALSYSI DIPSTICK (AUTOMATED)
Bilirubin, UA: NEGATIVE
Blood, UA: NEGATIVE
Glucose, UA: NEGATIVE
Ketones, UA: NEGATIVE
LEUKOCYTES UA: NEGATIVE
NITRITE UA: NEGATIVE
PH UA: 6 (ref 5.0–8.0)
PROTEIN UA: NEGATIVE
Spec Grav, UA: 1.02 (ref 1.010–1.025)
Urobilinogen, UA: NEGATIVE E.U./dL — AB
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
## 2017-11-06 NOTE — Patient Instructions (Addendum)
For your history of recent rhabdomyolysis, we will get a CK level today and CMP tomorrow but schedule appointment on way out for lab. Stay hydrated.  For history of substance abuse and need for Suboxone continuation near future we are trying to get you scheduled with [facility] [phone].  Westly Pam at ext 300 is person faciltating asap referral.  We need records from pain management consult before they can accept the referral.   [place] tomorrow of rt lower ext. Can get xrays as well.  Follow up next Friday or as needed

## 2017-11-06 NOTE — Telephone Encounter (Signed)
Please follow up and see if we can get hospital records. These will be important in order to refer him to clinic that can write suboxone. In addition they want to see notes from pain management that consulted on patient in hospital. These are required for him to be referred. Also there is paperwork for [place] to fill out that [facility] was supposed to fax over yesterday. Please see if we old records from hospital and referal form to fill out.

## 2017-11-07 ENCOUNTER — Ambulatory Visit (HOSPITAL_BASED_OUTPATIENT_CLINIC_OR_DEPARTMENT_OTHER)
Admission: RE | Admit: 2017-11-07 | Discharge: 2017-11-07 | Disposition: A | Discharge status: Home / Self Care | Payer: BLUE CROSS/BLUE SHIELD | Source: Ambulatory Visit | Attending: Medical | Admitting: Medical

## 2017-11-07 ENCOUNTER — Other Ambulatory Visit: Payer: Self-pay | Admitting: Medical

## 2017-11-07 ENCOUNTER — Other Ambulatory Visit (INDEPENDENT_AMBULATORY_CARE_PROVIDER_SITE_OTHER): Payer: BLUE CROSS/BLUE SHIELD

## 2017-11-07 ENCOUNTER — Telehealth: Payer: Self-pay | Admitting: Medical

## 2017-11-07 DIAGNOSIS — M7989 Other specified soft tissue disorders: Secondary | ICD-10-CM | POA: Insufficient documentation

## 2017-11-07 DIAGNOSIS — M79609 Pain in unspecified limb: Secondary | ICD-10-CM

## 2017-11-07 DIAGNOSIS — F1910 Other psychoactive substance abuse, uncomplicated: Secondary | ICD-10-CM

## 2017-11-07 DIAGNOSIS — M25571 Pain in right ankle and joints of right foot: Secondary | ICD-10-CM | POA: Diagnosis not present

## 2017-11-07 DIAGNOSIS — M79669 Pain in unspecified lower leg: Secondary | ICD-10-CM | POA: Insufficient documentation

## 2017-11-07 DIAGNOSIS — M25561 Pain in right knee: Secondary | ICD-10-CM | POA: Insufficient documentation

## 2017-11-07 DIAGNOSIS — M898X6 Other specified disorders of bone, lower leg: Secondary | ICD-10-CM

## 2017-11-07 DIAGNOSIS — T796XXD Traumatic ischemia of muscle, subsequent encounter: Secondary | ICD-10-CM

## 2017-11-07 LAB — COMPREHENSIVE METABOLIC PANEL
ALBUMIN: 4.6 g/dL (ref 3.5–5.2)
ALK PHOS: 73 U/L (ref 39–117)
ALT: 95 U/L — ABNORMAL HIGH (ref 0–53)
AST: 55 U/L — ABNORMAL HIGH (ref 0–37)
BILIRUBIN TOTAL: 0.6 mg/dL (ref 0.2–1.2)
BUN: 15 mg/dL (ref 6–23)
CALCIUM: 9.6 mg/dL (ref 8.4–10.5)
CO2: 30 mEq/L (ref 19–32)
CREATININE: 0.85 mg/dL (ref 0.40–1.50)
Chloride: 97 mEq/L (ref 96–112)
GFR: 115.42 mL/min (ref >60.00)
Glucose, Bld: 113 mg/dL — ABNORMAL HIGH (ref 70–99)
POTASSIUM: 4.3 meq/L (ref 3.5–5.1)
Sodium: 135 mEq/L (ref 135–145)
TOTAL PROTEIN: 7.3 g/dL (ref 6.0–8.3)

## 2017-11-07 LAB — CK: Total CK: 686 U/L — ABNORMAL HIGH (ref 7–232)

## 2017-11-07 NOTE — Telephone Encounter (Signed)
I placed referral in to [facility]. Will you coordinate with Gwenn on getting him in next week. His packet that they were supposed to fax over needs to be fill out by Monday or Tuesday. I think this would be required so he could be seen on Thursday.   Records on your desk and I have stamped/signed. So we can fax.

## 2017-11-07 NOTE — Telephone Encounter (Signed)
Please see the referral note that I placed.

## 2017-11-08 ENCOUNTER — Encounter: Payer: Self-pay | Admitting: Medical

## 2017-11-10 ENCOUNTER — Encounter: Payer: Self-pay | Admitting: Family Medicine

## 2017-11-10 ENCOUNTER — Ambulatory Visit (INDEPENDENT_AMBULATORY_CARE_PROVIDER_SITE_OTHER): Payer: BLUE CROSS/BLUE SHIELD | Admitting: Family Medicine

## 2017-11-10 ENCOUNTER — Telehealth: Payer: Self-pay | Admitting: Medical

## 2017-11-10 VITALS — BP 119/88 | HR 101 | Ht 72.0 in | Wt 200.0 lb

## 2017-11-10 DIAGNOSIS — M25551 Pain in right hip: Secondary | ICD-10-CM

## 2017-11-10 DIAGNOSIS — M79661 Pain in right lower leg: Secondary | ICD-10-CM

## 2017-11-10 DIAGNOSIS — M6080 Other myositis, unspecified site: Secondary | ICD-10-CM | POA: Diagnosis not present

## 2017-11-10 MED ORDER — PREDNISONE 10 MG PO TABS: ORAL_TABLET | ORAL | 0 refills | Status: DC

## 2017-11-10 NOTE — Telephone Encounter (Signed)
Pt has referral placed to see a clinic that could hopefull prescribe him suboxone. He has history of substance abuse. I saw him on Thursday. Information on office number to facility I am trying to get him into is in office note and referral.   I called that facility on day patient was in and explained his situation in order to investigate how to get quick referral.  We needed his records from the hospital which I signed and placed copied of those on jasmine desk Friday.  Gwenn notified referral/referral was placed.  I explained that pt referral packet needs to be filled out but we have not gotten that packet by fax as of Friday afternoon.   Looks like patient went and was evaluated by sports medicine today.  Has appointment to the clinic that can treat his substance abuse been set up?  He sent me my chart message explaining he will be out of suboxone. Special license required to prescribe suboxone. I am not in office tomorrow and likely not Wednesday as well. This is challenging situation in terms of timely referral. But can we get him in.

## 2017-11-10 NOTE — Patient Instructions (Signed)
You have a traumatic myositis of your right leg. Ice the areas 15 minutes at a time 3-4 times a day. Use crutches to help get around - transition off these when you're comfortable doing so. Try prednisone dose pack as directed for next 6 days. Avoid lower body weightlifting for the next 4 weeks. Avoid protein supplementation or other supplements including creatine. Salon pas patches may help with the pain as well. Consider topical aspercreme or capsaicin up to 4 times a day. Follow up with pain management physician. I'd expect this to improve quite a bit over the next 4 weeks. Follow up with me in 1 month.

## 2017-11-10 NOTE — Telephone Encounter (Signed)
Jasmine received referral form paperwork and gave to author to advise, as much of the information is social history-related, inquiring into DHHS and DJJDP history. Author phoned on-call referral coordinator Lawson Fiscal at [facility], and she was unsure of how to proceed, but recommended PCP fill out as much information as possible and follow-up with Sue Lush. PCP is out of the office until Thursday, and pt. Needs suboxone by tomorrow. Author phoned Eligah East from [facility], no answer, left detailed VM asking for call back #[phone] or #[phone]. Author then phoned pt. to update him on ppwk status, and to encourage pt. To seek help at behavioral health at [facility] to get needed treatment while referral is being worked on. Pt. verbalized understanding and asked author to reach out to mom, Marcelino Duster, at [phone] to let her know as well. Author left VM on Michelle's phone. Esperanza Richters, PA and [place], [place], made aware. Original form left on Edward's desk, copy left at [employer].

## 2017-11-10 NOTE — Telephone Encounter (Signed)
Tried to reach Clayton Stafford again no answer left a vm for her to call back.
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
## 2017-11-10 NOTE — Telephone Encounter (Signed)
Mother, Marcelino Duster, returned author's phone call and stated that son has been tapering his suboxone due to his limited supply in hopes of making an appointment with [facility] for Thursday, appointment unconfirmed; mother has attempted to contact andrea from [facility] several times apparently. Mom states pt. was given suboxone 2mg  to be taken bid since being discharged from [facility] in [place], and has recently starting tapering to daily. "I want to make sure he's getting off of it the right way". Pt's pain in leg is severe, with steroids helping somewhat. "I just don't want him to relapse", mom says. Marcelino Duster requested Dr. Abner Greenspan be made aware of the situation, as Dr. Abner Greenspan is familiar with the family and perhaps can advise further in Edward's absence. Routed to CMA, Leavy Cella, to retrieve hospital records that mother states we have.

## 2017-11-11 NOTE — Telephone Encounter (Signed)
Author spoke with Dedra Skeens, referral coordinator, who stated that form we received was faxed with other packet paperwork this AM, and clinic will reach out to mother to schedule appointment asap. Author phoned mother to notify her of status, left detailed VM.

## 2017-11-14 ENCOUNTER — Other Ambulatory Visit: Payer: Self-pay | Admitting: Family Medicine

## 2017-11-15 ENCOUNTER — Other Ambulatory Visit: Payer: Self-pay | Admitting: Family Medicine

## 2017-11-15 ENCOUNTER — Other Ambulatory Visit: Payer: Self-pay | Admitting: Osteopathic Medicine

## 2017-11-15 DIAGNOSIS — F3170 Bipolar disorder, currently in remission, most recent episode unspecified: Secondary | ICD-10-CM

## 2017-11-16 ENCOUNTER — Encounter: Payer: Self-pay | Admitting: Family Medicine

## 2017-11-17 MED ORDER — PREDNISONE 10 MG PO TABS: ORAL_TABLET | ORAL | 0 refills | Status: DC

## 2017-11-19 ENCOUNTER — Other Ambulatory Visit: Payer: Self-pay | Admitting: Medical

## 2017-11-19 ENCOUNTER — Telehealth: Payer: Self-pay | Admitting: Medical

## 2017-11-19 MED ORDER — GABAPENTIN 800 MG PO TABS: 800.0000 mg | ORAL_TABLET | Freq: Three times a day (TID) | ORAL | 0 refills | Status: DC

## 2017-11-19 NOTE — Telephone Encounter (Signed)
Pts mom requesting a call regarding the rx below. She states that her son is out of medication and they are wanting to try and have this refilled today.

## 2017-11-19 NOTE — Telephone Encounter (Signed)
Patient's mom called back stating he does need 800mg , 3x per day. He is out of the script. Say's patient has been accepted into substance abuse clinic. Please call mom to confirm when script is sent in to pharmacy.

## 2017-11-19 NOTE — Telephone Encounter (Signed)
Author phoned pt. to notify re: gabapentin. Author left detailed VM, asking for call back if pharmacy incorrect #[phone].

## 2017-11-19 NOTE — Telephone Encounter (Signed)
Copied from CRM [id number]. Topic: Quick Communication - Rx Refill/Question >> Nov 19, 2017  1:13 PM Mickel BaasMcGee, Jatoria Kneeland B, [place] wrote: **Patient states that he is now taking the 800mg .. Please advise that mg is not on his current med list**  Medication: gabapentin (NEURONTIN) 300 MG capsule  Has the patient contacted their pharmacy? No. (Agent: If no, request that the patient contact the pharmacy for the refill.) (Agent: If yes, when and what did the pharmacy advise?)  Preferred Pharmacy (with phone number or street name): [id number] [place], [place] - [address]  Agent: Please be advised that RX refills may take up to 3 business days. We ask that you follow-up with your pharmacy.

## 2017-11-19 NOTE — Telephone Encounter (Signed)
Will you let pt know I sent in gabapentin. Verify sent to correct pharmacy.

## 2017-11-19 NOTE — Telephone Encounter (Signed)
Will you call patient and notify gabapentin sent. Confirm that it was sent to pt pharmacy.

## 2017-11-19 NOTE — Telephone Encounter (Signed)
Patient called and advised the 800 mg gabapentin is not listed in his chart, that the only one is for 300 mg. He says he was prescribed it for pain in his legs when he was [place] in [place]. He says he brought the sheet from the hospital to his visit and told Ramon Dredgedward what he took. He says the 300 mg is the old prescription. I advised I will send this to Ramon Dredgedward and someone from the office may call him to discuss, he verbalized understanding.  Gabapentin refill (800 mg requested) Last refill of 300 mg 05/30/17 Last OV:11/06/17 ZOX:[id number]:[place] [id number] [place] [address] [phone] (Phone) [phone] (Fax)

## 2017-11-19 NOTE — Telephone Encounter (Signed)
Will you call mom or pt. Clarify dosage. If it is 800 mg tid. If so just forward to me and I will write the script. But important to clarify.  Also want to know did they eventually get in with the substance abuse clinic we were trying to get him into.

## 2017-11-20 MED ORDER — GABAPENTIN 800 MG PO TABS: 800.0000 mg | ORAL_TABLET | Freq: Three times a day (TID) | ORAL | 0 refills | Status: DC

## 2017-11-20 NOTE — Telephone Encounter (Signed)
Tried to reach pt's mother no answer an no vm.

## 2017-11-24 ENCOUNTER — Encounter: Payer: Self-pay | Admitting: Family Medicine

## 2017-11-24 ENCOUNTER — Ambulatory Visit (INDEPENDENT_AMBULATORY_CARE_PROVIDER_SITE_OTHER): Payer: BLUE CROSS/BLUE SHIELD | Admitting: Family Medicine

## 2017-11-24 VITALS — BP 142/71 | HR 90 | Ht 71.0 in | Wt 220.0 lb

## 2017-11-24 DIAGNOSIS — M6080 Other myositis, unspecified site: Secondary | ICD-10-CM | POA: Diagnosis not present

## 2017-11-24 NOTE — Patient Instructions (Signed)
Get labs tomorrow as we discussed. You have a traumatic myositis of your right leg. Ice the areas 15 minutes at a time 3-4 times a day at least. Use crutches to help get around - transition off these when you're comfortable doing so. We can't do more of the prednisone but if your labs come back ok we will add an anti-inflammatory and may start you in physical therapy to help regain your strength, decrease the spasms. Avoid protein supplementation or other supplements including creatine. Salon pas patches may help with the pain as well. Consider topical aspercreme or capsaicin up to 4 times a day. Follow up with me in 4 weeks otherwise but we will contact you on Wednesday with results and next steps.

## 2017-11-25 ENCOUNTER — Encounter: Payer: Self-pay | Admitting: Family Medicine
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
## 2017-11-26 ENCOUNTER — Encounter: Payer: Self-pay | Admitting: Family Medicine

## 2017-11-26 ENCOUNTER — Telehealth: Payer: Self-pay | Admitting: Family Medicine

## 2017-11-26 LAB — BASIC METABOLIC PANEL
BUN / CREAT RATIO: 22 — AB (ref 9–20)
BUN: 20 mg/dL (ref 6–20)
CO2: 27 mmol/L (ref 20–29)
Calcium: 10.1 mg/dL (ref 8.7–10.2)
Chloride: 94 mmol/L — ABNORMAL LOW (ref 96–106)
Creatinine, Ser: 0.89 mg/dL (ref 0.76–1.27)
GFR calc Af Amer: 136 mL/min/{1.73_m2} (ref >59)
GFR calc non Af Amer: 118 mL/min/{1.73_m2} (ref >59)
Glucose: 100 mg/dL — ABNORMAL HIGH (ref 65–99)
Potassium: 4.6 mmol/L (ref 3.5–5.2)
Sodium: 138 mmol/L (ref 134–144)

## 2017-11-26 LAB — CK: Total CK: 105 U/L (ref 24–204)

## 2017-11-26 MED ORDER — METHOCARBAMOL 500 MG PO TABS: 500.0000 mg | ORAL_TABLET | Freq: Three times a day (TID) | ORAL | 1 refills | Status: DC | PRN

## 2017-11-26 MED ORDER — DICLOFENAC SODIUM 75 MG PO TBEC: 75.0000 mg | DELAYED_RELEASE_TABLET | Freq: Two times a day (BID) | ORAL | 1 refills | Status: DC

## 2017-11-26 NOTE — Telephone Encounter (Signed)
Please call and let him know his lab results were normal.  Would add diclofenac twice a day with food and a muscle relaxant if he's amenable to this - please let me know (and which pharmacy) and I'll send in.  Thanks!

## 2017-11-26 NOTE — Telephone Encounter (Signed)
Sent both in for him.

## 2017-11-26 NOTE — Telephone Encounter (Signed)
Spoke to patient and told him labs were normal. He would like to add diclofenac and a muscle relaxant. [facility] [address]. Thanks!

## 2017-12-07 ENCOUNTER — Other Ambulatory Visit: Payer: Self-pay | Admitting: Osteopathic Medicine

## 2017-12-07 DIAGNOSIS — F3170 Bipolar disorder, currently in remission, most recent episode unspecified: Secondary | ICD-10-CM

## 2017-12-08 ENCOUNTER — Other Ambulatory Visit: Payer: Self-pay | Admitting: Osteopathic Medicine

## 2017-12-08 ENCOUNTER — Other Ambulatory Visit: Payer: Self-pay | Admitting: Medical

## 2017-12-08 ENCOUNTER — Encounter: Payer: Self-pay | Admitting: Medical

## 2017-12-08 DIAGNOSIS — F3170 Bipolar disorder, currently in remission, most recent episode unspecified: Secondary | ICD-10-CM

## 2017-12-08 MED ORDER — GABAPENTIN 800 MG PO TABS: 800.0000 mg | ORAL_TABLET | Freq: Three times a day (TID) | ORAL | 3 refills | Status: DC

## 2017-12-10 ENCOUNTER — Telehealth: Payer: Self-pay | Admitting: Medical

## 2017-12-10 DIAGNOSIS — F3170 Bipolar disorder, currently in remission, most recent episode unspecified: Secondary | ICD-10-CM

## 2017-12-10 MED ORDER — QUETIAPINE FUMARATE 200 MG PO TABS: ORAL_TABLET | ORAL | 0 refills | Status: DC

## 2017-12-10 MED ORDER — OXCARBAZEPINE 150 MG PO TABS: 450.0000 mg | ORAL_TABLET | Freq: Every day | ORAL | 0 refills | Status: DC

## 2017-12-10 NOTE — Telephone Encounter (Signed)
Refilled med but also sent my chart message response.

## 2017-12-10 NOTE — Telephone Encounter (Signed)
Prescription of Seroquel sent to patient's pharmacy.

## 2017-12-11 ENCOUNTER — Telehealth: Payer: Self-pay | Admitting: Medical

## 2017-12-11 ENCOUNTER — Ambulatory Visit: Payer: Self-pay | Admitting: Family Medicine

## 2017-12-11 ENCOUNTER — Encounter: Payer: Self-pay | Admitting: Medical

## 2017-12-11 MED ORDER — GABAPENTIN 800 MG PO TABS: 800.0000 mg | ORAL_TABLET | Freq: Three times a day (TID) | ORAL | 3 refills | Status: DC

## 2017-12-11 NOTE — Telephone Encounter (Signed)
Sent gabapentin to pt pharmacy.[address]

## 2017-12-11 NOTE — Telephone Encounter (Signed)
Will you call pt pharmacy and let them now I am authorziing early refill of gabapentin tid. I think they say he can't can refill for one week. But can they give him one week generic supply and pt states he will pay cash.

## 2017-12-11 NOTE — Telephone Encounter (Signed)
Copied from CRM [id number]. Topic: Quick Communication - See Telephone Encounter >> Dec 11, 2017  1:40 PM Terisa Starr wrote: CRM for notification. See Telephone encounter for: 12/11/17.  [facility] states that [id number] [place], [place] - [address] [place] [id number] for his Gabapentin. They stated they never received that on 10/7, he [id number] [place], [place] - [address] and they do have the script but he can not get it for another week. If he wants an early refill, they advised him that Esperanza Richters needs to call and approve this. Can you call the patient?

## 2017-12-11 NOTE — Telephone Encounter (Signed)
Patient states he got pharmicies confused [facility] [address] to [facility] meds up

## 2017-12-11 NOTE — Telephone Encounter (Signed)
Notified pharmacy okay to fill early. Pharma states they have on file that's pt does this a lot and wants early refills. Pharmacy wanted PCP to be aware. Tried to reach pt no answer sent mychart message letting pt know to only take TID.

## 2017-12-11 NOTE — Telephone Encounter (Signed)
Patient states he needs Ramon Dredge to contact the pharmacy as they are needing approval to get 21 tablets of Gabapentin. Requesting a call back.  [id number] [place] - [address]  [address] [place] [address]  Phone: [phone] Fax: [phone]

## 2017-12-15 ENCOUNTER — Ambulatory Visit (INDEPENDENT_AMBULATORY_CARE_PROVIDER_SITE_OTHER): Payer: BLUE CROSS/BLUE SHIELD | Admitting: Family Medicine

## 2017-12-15 ENCOUNTER — Encounter: Payer: Self-pay | Admitting: Family Medicine

## 2017-12-15 VITALS — BP 127/66 | HR 77 | Ht 71.0 in | Wt 200.0 lb

## 2017-12-15 DIAGNOSIS — M79671 Pain in right foot: Secondary | ICD-10-CM

## 2017-12-15 NOTE — Patient Instructions (Signed)
You have an extensor tendinitis of your foot and ankle along with neuropathic pain. Wear the boot when up and walking around. Icing 15 minutes at a time 3-4 times a day. Continue with the diclofenac, salon pas, topical biofreeze, gabapentin. Follow up with me in 1 month. With rest this should improve and hopefully be gone by the time we see you back.

## 2017-12-16 ENCOUNTER — Encounter: Payer: Self-pay | Admitting: Family Medicine

## 2017-12-16 NOTE — Progress Notes (Signed)
PCP: Mackie Pai, PA-C  Subjective:   HPI: Patient is a 27 y.o. male here for right foot pain.  Patient reports overall he's improved from his myositis. He states he noticed pain in right dorsal foot about 2 weeks ago. No new injuries. Pain level 7/10 and sharp, worse with walking. He's been using salon pas, diclofenac, robaxin, gabapentin, topical biofreeze. No skin changes, numbness. Some pain up to posterior thigh.  Past Medical History:  Diagnosis Date  . ADHD   . Anxiety   . Bipolar 1 disorder ([place])   . Bipolar 1 disorder ([place])   . Bipolar 1 disorder ([place])   . Depression     Current Outpatient Medications on File Prior to Visit  Medication Sig Dispense Refill  . buprenorphine-naloxone (SUBOXONE) 8-2 mg SUBL SL tablet PLACE HALF A TAB UNDER TONGUE TWICE DAILY AND DISSOLVE. DO NOT EAT/DRINK FOR 20 MINUTES AFTER.  0  . citalopram (CELEXA) 10 MG tablet Take by mouth.    . cloNIDine (CATAPRES) 0.1 MG tablet Take 1 tablet (0.1 mg total) by mouth 3 (three) times daily. Must follow up in one month. 270 tablet 0  . diclofenac (VOLTAREN) 75 MG EC tablet Take 1 tablet (75 mg total) by mouth 2 (two) times daily. 60 tablet 1  . divalproex (DEPAKOTE) 500 MG DR tablet Take by mouth.    . gabapentin (NEURONTIN) 800 MG tablet Take 1 tablet (800 mg total) by mouth 3 (three) times daily. 90 tablet 3  . methocarbamol (ROBAXIN) 500 MG tablet Take 1 tablet (500 mg total) by mouth every 8 (eight) hours as needed. 60 tablet 1  . NARCAN 4 MG/0.1ML LIQD nasal spray kit USE ONE TIME IF NEEDED (SPRAY INTO ONE NOSTRIL FOR EMERGENCY TREATMENT OF KNOWN OR SUSPECTED OPIATE OVERDOSE MAY REPEAT 2-3 MINUTES OPOSITE  5  . OXcarbazepine (TRILEPTAL) 150 MG tablet Take 3 tablets (450 mg total) by mouth daily. 1 tablet in AM and 2 tablets in PM. Must follow up in one month. 90 tablet 0  . predniSONE (DELTASONE) 10 MG tablet 6 tabs po days 1-2, 5 tabs po days 3-4, 4 tabs po days 5-6, 3 tabs po days 7-8, 2 tabs  po days 9-10, 1 tab po days 11-12 42 tablet 0  . QUEtiapine (SEROQUEL) 200 MG tablet 1 tablet p.o. nightly 30 tablet 0   No current facility-administered medications on file prior to visit.     History reviewed. No pertinent surgical history.  No Known Allergies  Social History   Socioeconomic History  . Marital status: Single    Spouse name: Not on file  . Number of children: Not on file  . Years of education: Not on file  . Highest education level: Not on file  Occupational History  . Occupation: Environmental consultant at [employer]  . Financial resource strain: Not hard at all  . Food insecurity:    Worry: Never true    Inability: Never true  . Transportation needs:    Medical: No    Non-medical: No  Tobacco Use  . Smoking status: Current Every Day Smoker    Packs/day: 2.00    Types: Cigarettes  . Smokeless tobacco: Never Used  Substance and Sexual Activity  . Alcohol use: Yes    Alcohol/week: 2.0 standard drinks    Types: 2 Cans of beer per week    Comment: weekends  . Drug use: Yes    Types: IV, "Crack" cocaine, Cocaine, Heroin, Methamphetamines, Marijuana, LSD  Comment: patient in recovery at this time  . Sexual activity: Yes  Lifestyle  . Physical activity:    Days per week: 7 days    Minutes per session: 60 min  . Stress: Not on file  Relationships  . Social connections:    Talks on phone: Not on file    Gets together: Not on file    Attends religious service: Not on file    Active member of club or organization: Not on file    Attends meetings of clubs or organizations: Not on file    Relationship status: Not on file  . Intimate partner violence:    Fear of current or ex partner: Not on file    Emotionally abused: Not on file    Physically abused: Not on file    Forced sexual activity: Not on file  Other Topics Concern  . Not on file  Social History Narrative  . Not on file    Family History  Problem Relation Age of Onset  . Depression  Father   . Heart attack Father   . Hypertension Mother   . Bladder Cancer Paternal Grandfather     BP 127/66   Pulse 77   Ht 5' 11"  (1.803 m)   Wt 200 lb (90.7 kg)   BMI 27.89 kg/m   Review of Systems: See HPI above.     Objective:  Physical Exam:  Gen: NAD, comfortable in exam room  Right foot/ankle: No gross deformity, swelling, ecchymoses FROM with 5/5 strength, mild pain extension ankle, digits. TTP dorsally over extensor digitorum.  No focal bony tenderness. Negative ant drawer and talar tilt.   Negative syndesmotic compression. Negative metatarsal squeeze. Thompsons test negative. NV intact distally.  Left foot/ankle: No deformity. FROM with 5/5 strength. No tenderness to palpation. NVI distally.   Assessment & Plan:  1. Right foot/ankle pain - exam is reassuring.  Consistent with extensor tendinitis with concurrent neuropathic pain in right leg.  Cam walker with weight bearing.  Icing.  Continue diclofenac, salon pas, biofreeze, gabapentin.  F/u in 1 month.

## 2017-12-17 ENCOUNTER — Ambulatory Visit: Payer: BLUE CROSS/BLUE SHIELD | Admitting: Medical

## 2017-12-18 ENCOUNTER — Encounter: Payer: Self-pay | Admitting: Medical

## 2017-12-18 ENCOUNTER — Ambulatory Visit: Payer: BLUE CROSS/BLUE SHIELD | Admitting: Medical

## 2017-12-18 VITALS — BP 130/62 | HR 97 | Temp 98.4°F | Resp 16 | Ht 71.0 in | Wt 206.6 lb

## 2017-12-18 DIAGNOSIS — F3130 Bipolar disorder, current episode depressed, mild or moderate severity, unspecified: Secondary | ICD-10-CM | POA: Diagnosis not present

## 2017-12-18 DIAGNOSIS — Z136 Encounter for screening for cardiovascular disorders: Secondary | ICD-10-CM

## 2017-12-18 DIAGNOSIS — M79604 Pain in right leg: Secondary | ICD-10-CM

## 2017-12-18 DIAGNOSIS — F1910 Other psychoactive substance abuse, uncomplicated: Secondary | ICD-10-CM | POA: Diagnosis not present

## 2017-12-18 DIAGNOSIS — T796XXD Traumatic ischemia of muscle, subsequent encounter: Secondary | ICD-10-CM

## 2017-12-18 MED ORDER — PREDNISONE 10 MG PO TABS: ORAL_TABLET | ORAL | 0 refills | Status: DC
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
## 2017-12-18 NOTE — Patient Instructions (Addendum)
For your persisting right lateral calf region pain, right ankle pain, right foot pain(which Dr. Pearletha Forge thinks is tendinitis type pain), I want you to hold diclofenac starting tomorrow and start prednisone 6-day taper dose.  Also provide you with 2 Ace bandages to compress right calf ankle and foot region.  Apply with moderate pressure but not too tight to affect circulation.  Continue with gabapentin at current dose.  You are already at high dose and if pain persists could touch base with pharmacist as to what might be the maximum dose.  Usually I will need to prescribe 2400 mg/day.  For bipolar history, I recently refilled your medications.  I do want you to go ahead and start to call some of the numbers on these psychiatry referral sheet I gave you.  With the type of medications you are on I do want to have a plan in place in the event that your mood were to worsen.  I did get EKG today to make sure I did not see any QT prolongation as you are on Seroquel and Suboxone to help you with a substance abuse disorder.(ekg showed normal sinus rhythm)  For substance abuse history, continue with clinic that is prescribing you Suboxone.  Follow-up in 7 days or as needed.

## 2017-12-25 ENCOUNTER — Ambulatory Visit: Payer: Self-pay | Admitting: Family Medicine

## 2018-01-06 ENCOUNTER — Other Ambulatory Visit: Payer: Self-pay | Admitting: Medical

## 2018-01-06 DIAGNOSIS — F3170 Bipolar disorder, currently in remission, most recent episode unspecified: Secondary | ICD-10-CM

## 2018-01-08 ENCOUNTER — Other Ambulatory Visit: Payer: Self-pay

## 2018-01-08 ENCOUNTER — Telehealth: Payer: Self-pay | Admitting: Medical

## 2018-01-08 ENCOUNTER — Ambulatory Visit: Payer: BLUE CROSS/BLUE SHIELD | Admitting: Medical

## 2018-01-08 ENCOUNTER — Encounter: Payer: Self-pay | Admitting: Medical

## 2018-01-08 VITALS — BP 126/70 | HR 83 | Temp 97.7°F | Resp 16 | Ht 71.0 in | Wt 215.0 lb

## 2018-01-08 DIAGNOSIS — L089 Local infection of the skin and subcutaneous tissue, unspecified: Secondary | ICD-10-CM | POA: Diagnosis not present

## 2018-01-08 DIAGNOSIS — L739 Follicular disorder, unspecified: Secondary | ICD-10-CM | POA: Diagnosis not present

## 2018-01-08 DIAGNOSIS — M779 Enthesopathy, unspecified: Secondary | ICD-10-CM | POA: Diagnosis not present

## 2018-01-08 DIAGNOSIS — F3170 Bipolar disorder, currently in remission, most recent episode unspecified: Secondary | ICD-10-CM

## 2018-01-08 DIAGNOSIS — M79671 Pain in right foot: Secondary | ICD-10-CM

## 2018-01-08 MED ORDER — DOXYCYCLINE HYCLATE 100 MG PO TABS: 100.0000 mg | ORAL_TABLET | Freq: Two times a day (BID) | ORAL | 0 refills | Status: DC

## 2018-01-08 MED ORDER — DICLOFENAC SODIUM 75 MG PO TBEC: 75.0000 mg | DELAYED_RELEASE_TABLET | Freq: Two times a day (BID) | ORAL | 0 refills | Status: DC

## 2018-01-08 NOTE — Telephone Encounter (Signed)
Would you call and let pt know that I did review last note of sports med MD. He wanted pt to follow up around 01-15-2018. So ask him to keep that appointment and ask Dr. Pearletha Forge about PT. He did not list that in his plan but may agree after follow up.

## 2018-01-08 NOTE — Patient Instructions (Addendum)
For your history of recent extender tendinitis, want to continue with current medication regimen.  I am going to give you ace wrap again and advised light compression as that did help some.  I am also going to send Dr. Pearletha Forge and note and see if you think you would benefit with physical therapy.  For your right foot/heel region pain, I did place x-ray order to evaluate your foot and see if you have any spurs.  Would recommend that you eat a useful Dr.Scholl pad.  Can continue diclofenac.   You do appear to have chronic recurrent irritated area behind the right ear.  In addition some folliculitis to skin presently.  I am prescribing doxycycline antibiotic.  Rx advisement given.  If this is not a clear area completely particularly the region behind the ear then I will refer you to a dermatologist.  Please give me an update in 10 days.  Follow-up in 10 days or as needed.

## 2018-01-08 NOTE — Progress Notes (Signed)
Pt. States he has been taking gapapentin and diclofenac for pain.

## 2018-01-08 NOTE — Progress Notes (Signed)
   Subjective:    Patient ID: Clayton Stafford, male    DOB: Oct 08, 1990, 27 y.o.   MRN: [id number]  HPI  Pt in for follow up for his rt lower ext tendinitis. Pt states slow/gradual improvement.  He states hurts to wear shoe. He did say ace compression wrap seemed to help.  Sport med A and P read. "You have an extensor tendinitis of your foot and ankle along with neuropathic pain. Wear the boot when up and walking around. Icing 15 minutes at a time 3-4 times a day. Continue with the diclofenac, salon pas, topical biofreeze, gabapentin. Follow up with me in 1 month. With rest this should improve and hopefully be gone by the time we see you back."  In addition just recently he had some pain on bottom of his foot. Some pain on walking.    Pt also  has area on back of his rt  ear. On and off irritation and pain. He states will drain intermittently. Also recent red bumps on side of his face.  Pt mood is controlled today. I did refill his trilleptal.      Review of Systems  Constitutional: Negative for chills, fatigue and fever.  Respiratory: Negative for choking, chest tightness, shortness of breath and wheezing.   Cardiovascular: Negative for chest pain and palpitations.  Gastrointestinal: Negative for abdominal pain.  Musculoskeletal:       See HPI.  Skin:       Folliculitis and skin infection.  Neurological: Negative for dizziness and headaches.  Hematological: Negative for adenopathy. Does not bruise/bleed easily.  Psychiatric/Behavioral: Positive for dysphoric mood. Negative for sleep disturbance and suicidal ideas.       But stable presently.       Objective:   Physical Exam  General- No acute distress. Pleasant patient. Neck- Full range of motion, no jvd Lungs- Clear, even and unlabored. Heart- regular rate and rhythm. Neurologic- CNII- XII grossly intact.   Right lower extremity- faint tenderness to palpation lateral aspect of calf.  Calf does not appear  swollen presently.  Negative Homans sign.  Right foot- tenderness to palpation top aspect of foot.  No redness, no warmth or edema. On bottom aspect of foot mild tenderness to palpation of the arch.  Direct tenderness palpation region where plantar fascia connects to calcaneal bone.      Assessment & Plan:  For your history of recent extender tendinitis, want to continue with current medication regimen.  I am going to give you ace wrap again and advised light compression as that did help some.  I am also going to send Dr. Pearletha Forge and note and see if you think you would benefit with physical therapy.  For your right foot/heel region pain, I did place x-ray order to evaluate your foot and see if you have any spurs.  Would recommend that you eat a useful Dr.Scholl pad.  Can continue diclofenac.   You do appear to have chronic recurrent irritated area behind the right ear.  In addition some folliculitis to skin presently.  I am prescribing doxycycline antibiotic.  Rx advisement given.  If this is not a clear area completely particularly the region behind the ear then I will refer you to a dermatologist.  Please give me an update in 10 days.  Follow-up in 10 days or as needed.  Also reminded pt that I wanted him to establish psychiatric care. He assures me he will. Gave list of psychiatrist sheet again.

## 2018-01-13 NOTE — Telephone Encounter (Signed)
Author phoned pt. relay Edward's message. Pt. seemed distracted, did not remember about 11/14 appointment. Author reminded pt, about day/time and pt. said "ok", and then hung up. [place]ed [place].

## 2018-01-15 ENCOUNTER — Encounter: Payer: Self-pay | Admitting: Family Medicine

## 2018-01-15 ENCOUNTER — Ambulatory Visit (HOSPITAL_BASED_OUTPATIENT_CLINIC_OR_DEPARTMENT_OTHER)
Admission: RE | Admit: 2018-01-15 | Discharge: 2018-01-15 | Disposition: A | Discharge status: Home / Self Care | Payer: BLUE CROSS/BLUE SHIELD | Source: Ambulatory Visit | Attending: Medical | Admitting: Medical

## 2018-01-15 ENCOUNTER — Ambulatory Visit: Payer: BLUE CROSS/BLUE SHIELD | Admitting: Family Medicine

## 2018-01-15 VITALS — BP 157/73 | HR 98 | Ht 71.0 in | Wt 210.0 lb

## 2018-01-15 DIAGNOSIS — M79671 Pain in right foot: Secondary | ICD-10-CM | POA: Diagnosis present

## 2018-01-15 MED ORDER — GABAPENTIN 800 MG PO TABS: 1200.0000 mg | ORAL_TABLET | Freq: Three times a day (TID) | ORAL | 1 refills | Status: DC

## 2018-01-15 NOTE — Patient Instructions (Addendum)
Start physical therapy for extensor tendinitis of your foot/ankle. Increased gabapentin to 1.5 tablets three times a day of your 800mg  tablets. Continue the diclofenac - hopefully you can wean down to taking as needed. Follow up with me in 6 weeks for reevaluation.

## 2018-01-15 NOTE — Progress Notes (Signed)
PCP: Mackie Pai, PA-C  Subjective:   HPI: Patient is a 27 y.o. male here for right foot pain.  10/14: Patient reports overall he's improved from his myositis. He states he noticed pain in right dorsal foot about 2 weeks ago. No new injuries. Pain level 7/10 and sharp, worse with walking. He's been using salon pas, diclofenac, robaxin, gabapentin, topical biofreeze. No skin changes, numbness. Some pain up to posterior thigh.  11/14: Patient reports he's mildly improved compared to last visit. Wore boot only for a couple days - hurt worse to have this on. Taking diclofenac with gabapentin 800 tid, robaxin occasionally. Pain level 6/10 and sharp dorsal foot/ankle area. No skin changes, numbness.  Past Medical History:  Diagnosis Date  . ADHD   . Anxiety   . Bipolar 1 disorder ([place])   . Bipolar 1 disorder ([place])   . Bipolar 1 disorder ([place])   . Depression     Current Outpatient Medications on File Prior to Visit  Medication Sig Dispense Refill  . buprenorphine-naloxone (SUBOXONE) 8-2 mg SUBL SL tablet PLACE HALF A TAB UNDER TONGUE TWICE DAILY AND DISSOLVE. DO NOT EAT/DRINK FOR 20 MINUTES AFTER.  0  . cloNIDine (CATAPRES) 0.1 MG tablet Take 1 tablet (0.1 mg total) by mouth 3 (three) times daily. Must follow up in one month. (Patient not taking: Reported on 01/08/2018) 270 tablet 0  . diclofenac (VOLTAREN) 75 MG EC tablet Take 1 tablet (75 mg total) by mouth 2 (two) times daily. 60 tablet 0  . doxycycline (VIBRA-TABS) 100 MG tablet Take 1 tablet (100 mg total) by mouth 2 (two) times daily. Can give caps or generic 14 tablet 0  . methocarbamol (ROBAXIN) 500 MG tablet Take 1 tablet (500 mg total) by mouth every 8 (eight) hours as needed. 60 tablet 1  . NARCAN 4 MG/0.1ML LIQD nasal spray kit USE ONE TIME IF NEEDED (SPRAY INTO ONE NOSTRIL FOR EMERGENCY TREATMENT OF KNOWN OR SUSPECTED OPIATE OVERDOSE MAY REPEAT 2-3 MINUTES OPOSITE  5  . OXcarbazepine (TRILEPTAL) 150 MG tablet 1 tab  po in am and 2 tab po pm 90 tablet 0  . QUEtiapine (SEROQUEL) 200 MG tablet TAKE 1 TABLET BY MOUTH EVERY DAY AT BEDTIME 30 tablet 1   No current facility-administered medications on file prior to visit.     History reviewed. No pertinent surgical history.  No Known Allergies  Social History   Socioeconomic History  . Marital status: Single    Spouse name: Not on file  . Number of children: Not on file  . Years of education: Not on file  . Highest education level: Not on file  Occupational History  . Occupation: Environmental consultant at [employer]  . Financial resource strain: Not hard at all  . Food insecurity:    Worry: Never true    Inability: Never true  . Transportation needs:    Medical: No    Non-medical: No  Tobacco Use  . Smoking status: Current Every Day Smoker    Packs/day: 2.00    Types: Cigarettes  . Smokeless tobacco: Never Used  Substance and Sexual Activity  . Alcohol use: Yes    Alcohol/week: 2.0 standard drinks    Types: 2 Cans of beer per week    Comment: weekends  . Drug use: Yes    Types: IV, "Crack" cocaine, Cocaine, Heroin, Methamphetamines, Marijuana, LSD    Comment: patient in recovery at this time  . Sexual activity: Yes  Lifestyle  .  Physical activity:    Days per week: 7 days    Minutes per session: 60 min  . Stress: Not on file  Relationships  . Social connections:    Talks on phone: Not on file    Gets together: Not on file    Attends religious service: Not on file    Active member of club or organization: Not on file    Attends meetings of clubs or organizations: Not on file    Relationship status: Not on file  . Intimate partner violence:    Fear of current or ex partner: Not on file    Emotionally abused: Not on file    Physically abused: Not on file    Forced sexual activity: Not on file  Other Topics Concern  . Not on file  Social History Narrative  . Not on file    Family History  Problem Relation Age of Onset  .  Depression Father   . Heart attack Father   . Hypertension Mother   . Bladder Cancer Paternal Grandfather     BP (!) 157/73   Pulse 98   Ht 5' 11"  (1.803 m)   Wt 210 lb (95.3 kg)   BMI 29.29 kg/m   Review of Systems: See HPI above.     Objective:  Physical Exam:  Gen: NAD, comfortable in exam room  Right foot/ankle: No gross deformity, swelling, ecchymoses FROM with 5/5 strength without pain. TTP over extensor digitorum.  No other tenderness. Negative ant drawer and talar tilt.   Negative syndesmotic compression. Thompsons test negative. NV intact distally.   Assessment & Plan:  1. Right foot/ankle pain - exam again reassuring.  Icing as needed.  Continue diclofenac.  Increase gabapentin to 1217m tid.  Start physical therapy and home exercises.  Did not tolerate cam walker.  No evidence CRPS.  F/u in 6 weeks.

## 2018-02-02 ENCOUNTER — Other Ambulatory Visit: Payer: Self-pay

## 2018-02-02 ENCOUNTER — Ambulatory Visit: Payer: BLUE CROSS/BLUE SHIELD | Attending: Family Medicine | Admitting: Physical Therapy

## 2018-02-02 ENCOUNTER — Encounter: Payer: Self-pay | Admitting: Physical Therapy

## 2018-02-02 DIAGNOSIS — R262 Difficulty in walking, not elsewhere classified: Secondary | ICD-10-CM | POA: Insufficient documentation

## 2018-02-02 DIAGNOSIS — R29898 Other symptoms and signs involving the musculoskeletal system: Secondary | ICD-10-CM | POA: Insufficient documentation

## 2018-02-02 DIAGNOSIS — M79671 Pain in right foot: Secondary | ICD-10-CM | POA: Diagnosis present
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
## 2018-02-04 ENCOUNTER — Ambulatory Visit: Payer: BLUE CROSS/BLUE SHIELD | Admitting: Physical Therapy

## 2018-02-05 ENCOUNTER — Other Ambulatory Visit: Payer: Self-pay | Admitting: Medical

## 2018-02-05 DIAGNOSIS — F3170 Bipolar disorder, currently in remission, most recent episode unspecified: Secondary | ICD-10-CM

## 2018-02-06 ENCOUNTER — Other Ambulatory Visit: Payer: Self-pay | Admitting: Medical

## 2018-02-06 DIAGNOSIS — F3170 Bipolar disorder, currently in remission, most recent episode unspecified: Secondary | ICD-10-CM

## 2018-02-06 MED ORDER — QUETIAPINE FUMARATE 200 MG PO TABS: ORAL_TABLET | ORAL | 2 refills | Status: DC

## 2018-02-06 NOTE — Telephone Encounter (Signed)
Pt requesting 90 day supply on Seroquel. Please advise.

## 2018-02-06 NOTE — Telephone Encounter (Signed)
I did refill his meds but sent to local pharmacy. Did not give 90 day supply.I typically don't do this for controlled meds or psychiatric medication(only 30 tab rx). I have told patient I am filling his meds temporarily until he can establish care with psychiatrist.  I have explained to him this would be best in the event his mood worsens in future he could get advisement on how to treat current treatment if needed.   So if you can ask has he reached out to local psychiatrist? What is name of that specialist. If he did reach out when is that appointment.   Document he was advised and let me know what he states.   Note did review pt cbc in sept done in hosital. He did have wbc, platelt and hb/hct done.

## 2018-02-06 NOTE — Telephone Encounter (Signed)
Refill Request: Oxcarbazepine  Last RX:01/07/18 Last OV:01/08/18 Next ZO:XWRUOV:None scheduled  UDS: CSC: CSR:

## 2018-02-10 ENCOUNTER — Encounter: Payer: Self-pay | Admitting: Physical Therapy

## 2018-02-10 ENCOUNTER — Ambulatory Visit: Payer: BLUE CROSS/BLUE SHIELD | Admitting: Physical Therapy

## 2018-02-10 DIAGNOSIS — R29898 Other symptoms and signs involving the musculoskeletal system: Secondary | ICD-10-CM

## 2018-02-10 DIAGNOSIS — R262 Difficulty in walking, not elsewhere classified: Secondary | ICD-10-CM

## 2018-02-10 DIAGNOSIS — M79671 Pain in right foot: Secondary | ICD-10-CM

## 2018-02-10 NOTE — Telephone Encounter (Signed)
Pt notified. Pt voiced understanding  

## 2018-02-10 NOTE — Therapy (Signed)
[facility] [address]  [address] [place], [place], [address] Phone: [phone]   Fax:  [phone]  Physical Therapy Treatment  Patient Details  Name: Clayton Stafford MRN: [id number] Date of Birth: 01/12/1991 Referring Provider (PT): Norton BlizzardShane Hudnall, MD   Encounter Date: 02/10/2018  PT End of Session - 02/10/18 1450    Visit Number  2    Number of Visits  13    Date for PT Re-Evaluation  03/16/18    Authorization Type  BCBS; VL: 30    Authorization - Visit Number  8    Authorization - Number of Visits  30    PT Start Time  1409    PT Stop Time  1447    PT Time Calculation (min)  38 min    Activity Tolerance  Patient tolerated treatment well;Patient limited by pain    Behavior During Therapy  [facility] for tasks assessed/performed       Past Medical History:  Diagnosis Date  . ADHD   . Anxiety   . Bipolar 1 disorder (HCC)   . Bipolar 1 disorder (HCC)   . Bipolar 1 disorder (HCC)   . Depression     History reviewed. No pertinent surgical history.  There were no vitals filed for this visit.  Subjective Assessment - 02/10/18 1410    Subjective  Reports he has been doing pretty good. Reports having R knee "cramps" and difficulty getting up. May be d/t the weather. Has not been doing his exercises because he forgot.    Pertinent History  anxiety, depression, bipolar disorder, ADHD    Diagnostic tests  01/15/18 R foot xray: no acute abnormality noted     Patient Stated Goals  "my body to get better at stretching and get back in shape"    Currently in Pain?  No/denies                       [facility]/Exercise - 02/10/18 0001      Exercises   Exercises  Knee/Hip;Ankle      Knee/Hip Exercises: Stretches   Passive Hamstring Stretch  Right;Left;2 reps;30 seconds;Limitations    Passive Hamstring Stretch Limitations  supine strap    Piriformis Stretch  Right;Left;1 rep;30  seconds;Limitations    Piriformis Stretch Limitations  KTOS to tol    Other Knee/Hip Stretches  R & L SKTC x30 sec to tol      Manual Therapy   Manual Therapy  Passive ROM    Passive ROM  R ankle PF stretch to tolerance 2x30"      Ankle Exercises: Stretches   Gastroc Stretch  2 reps;30 seconds    Gastroc Stretch Limitations  at wall, to tol    Other Stretch  R toe flexion stretch to tolerance 2x30"      Ankle Exercises: Aerobic   Stationary Bike  L3 x 6min   had to drop down to L2 d/t fatigue     Ankle Exercises: Standing   Heel Raises  Both;10 reps   poor control   Heel Raises Limitations  at UBE; B LE conc, R LE ecc      Ankle Exercises: Seated   Towel Crunch  3 reps    Towel Crunch Limitations  R LE, length of hand towel 3x    Towel Inversion/Eversion  Limitations    Towel Inversion/Eversion Limitations  R ankle inversion with red TB, eversion with green  TB x20   unable to tolerate inversion with green TB d/t pain   Other Seated Ankle Exercises  R ankle PF & DF with green TB x20 each    Other Seated Ankle Exercises  R ankle ABC's x2              PT Education - 02/10/18 1449    Education Details  edu on importance of compliance with HEP for max progress    Person(s) Educated  Patient    Methods  Explanation;Demonstration;Tactile cues;Verbal cues;Handout    Comprehension  Verbalized understanding;Returned demonstration       PT Short Term Goals - 02/10/18 1501      PT SHORT TERM GOAL #1   Title  Patient to be independent with initial HEP.    Time  3    Period  Weeks    Status  On-going        PT Long Term Goals - 02/10/18 1501      PT LONG TERM GOAL #1   Title  Patient to be independent with advanced HEP.    Time  6    Period  Weeks    Status  On-going      PT LONG TERM GOAL #2   Title  Patient to demonstrate [facility] and pain-free R ankle AROM.     Time  6    Period  Weeks    Status  On-going      PT LONG TERM GOAL #3   Title  Patient to  demonstrate B LE strength >=4+/5.    Time  6    Period  Weeks    Status  On-going      PT LONG TERM GOAL #4   Title  Patient to report tolerance of a full day on his feet without pain limiting.     Time  6    Period  Weeks    Status  On-going            Plan - 02/10/18 1451    Clinical Impression Statement  Patient arrived to session with report of no difference in symptoms and report of noncompliance with HEP. Spoke with patient about importance of HEP compliance for maximal progress. Reviewed HEP with patient with cues required to correct form and alignment. Advised patient to avoid pushing into pain. Patient reporting persisting numbness in B HS- addressed this with LE stretching which patient reported relief with. Introduced eccentric R LE heel raises with poor control and considerable cues to decrease speed. Offered KT taping to R foot for pain relief- patient declined. Ended session with no complaints.     PT Treatment/Interventions  ADLs/Self Care Home Management;Cryotherapy;Electrical Stimulation;Iontophoresis 4mg /ml Dexamethasone;Moist Heat;Ultrasound;DME Instruction;Gait training;Stair training;Functional mobility training;Therapeutic activities;Therapeutic exercise;Manual techniques;Patient/family education;Orthotic Fit/Training;Neuromuscular re-education;Balance training;Passive range of motion;Dry needling;Energy conservation;Splinting;Taping;Vasopneumatic Device    PT Next Visit Plan  progress ankle stretching and strengthening     Consulted and Agree with Plan of Care  Patient       Patient will benefit from skilled therapeutic intervention in order to improve the following deficits and impairments:  Increased edema, Decreased activity tolerance, Decreased strength, Pain, Difficulty walking, Decreased balance, Decreased range of motion, Postural dysfunction, Impaired flexibility  Visit Diagnosis: Pain in right foot  Other symptoms and signs involving the musculoskeletal  system  Difficulty in walking, not elsewhere classified     Problem List Patient Active Problem List   Diagnosis Date Noted  . Bipolar disorder in partial remission (HCC) 12/27/2015  .  Family history of heart attack 12/27/2015  . Substance induced mood disorder (HCC) 09/05/2014  . Polysubstance dependence (HCC) 09/05/2014     Anette Guarneri, PT, DPT 02/10/18 3:02 PM   [facility] [address]  [address] [place], [place], [address] Phone: [phone]   Fax:  [phone]  Name: Clayton Stafford MRN: [id number] Date of Birth: April 09, 1990

## 2018-02-13 ENCOUNTER — Other Ambulatory Visit: Payer: Self-pay | Admitting: Family Medicine

## 2018-02-16 ENCOUNTER — Ambulatory Visit: Payer: BLUE CROSS/BLUE SHIELD

## 2018-02-16 DIAGNOSIS — R262 Difficulty in walking, not elsewhere classified: Secondary | ICD-10-CM

## 2018-02-16 DIAGNOSIS — M79671 Pain in right foot: Secondary | ICD-10-CM

## 2018-02-16 DIAGNOSIS — R29898 Other symptoms and signs involving the musculoskeletal system: Secondary | ICD-10-CM

## 2018-02-16 NOTE — Therapy (Signed)
[facility] [address]  [address] [place], [place], [address] Phone: [phone]   Fax:  [phone]  Physical Therapy Treatment  Patient Details  Name: Clayton Stafford MRN: [id number] Date of Birth: [birth year] Referring Provider (PT): Norton Blizzard, MD   Encounter Date: 02/16/2018  PT End of Session - 02/16/18 1453    Visit Number  3    Number of Visits  13    Date for PT Re-Evaluation  03/16/18    Authorization Type  BCBS; VL: 30    Authorization - Visit Number  9    Authorization - Number of Visits  30    PT Start Time  1451    PT Stop Time  1530    PT Time Calculation (min)  39 min    Activity Tolerance  Patient tolerated treatment well    Behavior During Therapy  [facility] for tasks assessed/performed       Past Medical History:  Diagnosis Date  . ADHD   . Anxiety   . Bipolar 1 disorder (HCC)   . Bipolar 1 disorder (HCC)   . Bipolar 1 disorder (HCC)   . Depression     No past surgical history on file.  There were no vitals filed for this visit.  Subjective Assessment - 02/16/18 1452    Subjective  Pt. reporting he has R knee pain on and off over the last few days.      Pertinent History  anxiety, depression, bipolar disorder, ADHD    Diagnostic tests  01/15/18 R foot xray: no acute abnormality noted     Patient Stated Goals  "my body to get better at stretching and get back in shape"    Currently in Pain?  No/denies    Pain Score  0-No pain    Multiple Pain Sites  No                       [facility] Adult PT Treatment/Exercise - 02/16/18 1456      Knee/Hip Exercises: Stretches   Active Hamstring Stretch  Right;Left;20 seconds   cues required for proper hold times   Active Hamstring Stretch Limitations  B seated     Piriformis Stretch  Right;Left;1 rep;30 seconds;Limitations   cues required for proper hold times   Piriformis Stretch Limitations  KTOS to tol      Knee/Hip Exercises:  Aerobic   Recumbent Bike  Lvl 2, 6 min       Knee/Hip Exercises: Supine   Bridges with [employer]  Both;Strengthening;15 reps   cues required for proper hold times   Single Leg Bridge  --   terminated due to poor control      Ankle Exercises: Stretches   Soleus Stretch  2 reps;30 seconds    Soleus Stretch Limitations  into wall     Gastroc Stretch  2 reps;30 seconds    Gastroc Stretch Limitations  into wall       Ankle Exercises: Seated   Other Seated Ankle Exercises  B ankle EV, IV  with red looped TB x 20 reps    Cues required for pacing    Other Seated Ankle Exercises  R ankle DF with green TB x 15 reps   Cues required for pacing      Ankle Exercises: Standing   Heel Raises  Both;15 reps   Cues required for pacing    Heel Raises Limitations  at UBE  Ankle Exercises: Aerobic   Recumbent Bike  Lvl 3, 6 min                PT Short Term Goals - 02/16/18 1504      PT SHORT TERM GOAL #1   Title  Patient to be independent with initial HEP.    Time  3    Period  Weeks    Status  Achieved        PT Long Term Goals - 02/10/18 1501      PT LONG TERM GOAL #1   Title  Patient to be independent with advanced HEP.    Time  6    Period  Weeks    Status  On-going      PT LONG TERM GOAL #2   Title  Patient to demonstrate [facility] and pain-free R ankle AROM.     Time  6    Period  Weeks    Status  On-going      PT LONG TERM GOAL #3   Title  Patient to demonstrate B LE strength >=4+/5.    Time  6    Period  Weeks    Status  On-going      PT LONG TERM GOAL #4   Title  Patient to report tolerance of a full day on his feet without pain limiting.     Time  6    Period  Weeks    Status  On-going            Plan - 02/16/18 1454    Clinical Impression Statement  Clayton Stafford reporting R knee pain, "on and off" over past few days without known trigger.  Denies R foot/ankle pain today.  Tolerated progression of supine and standing therex well today however  required frequent cueing for proper pacing and technique with TB resisted activities.  Ended visit pain free thus modalities deferred.      Rehab Potential  Good    PT Frequency  2x / week    PT Duration  6 weeks    PT Treatment/Interventions  ADLs/Self Care Home Management;Cryotherapy;Electrical Stimulation;Iontophoresis 4mg /ml Dexamethasone;Moist Heat;Ultrasound;DME Instruction;Gait training;Stair training;Functional mobility training;Therapeutic activities;Therapeutic exercise;Manual techniques;Patient/family education;Orthotic Fit/Training;Neuromuscular re-education;Balance training;Passive range of motion;Dry needling;Energy conservation;Splinting;Taping;Vasopneumatic Device    PT Next Visit Plan  progress ankle stretching and strengthening; monitor response to therex progression    Consulted and Agree with Plan of Care  Patient       Patient will benefit from skilled therapeutic intervention in order to improve the following deficits and impairments:  Increased edema, Decreased activity tolerance, Decreased strength, Pain, Difficulty walking, Decreased balance, Decreased range of motion, Postural dysfunction, Impaired flexibility  Visit Diagnosis: Pain in right foot  Other symptoms and signs involving the musculoskeletal system  Difficulty in walking, not elsewhere classified     Problem List Patient Active Problem List   Diagnosis Date Noted  . Bipolar disorder in partial remission (HCC) 12/27/2015  . Family history of heart attack 12/27/2015  . Substance induced mood disorder (HCC) 09/05/2014  . Polysubstance dependence (HCC) 09/05/2014    Kermit BaloMicah Drema Eddington, PTA 02/16/18 5:59 PM   [facility] [place] [address]  [address] [place], [place], [address] Phone: [phone]   Fax:  [phone]  Name: Clayton Stafford MRN: [id number] Date of Birth: 02/03/1991

## 2018-02-17 ENCOUNTER — Other Ambulatory Visit: Payer: Self-pay | Admitting: Medical

## 2018-02-19 ENCOUNTER — Ambulatory Visit: Payer: BLUE CROSS/BLUE SHIELD

## 2018-02-19 DIAGNOSIS — M79671 Pain in right foot: Secondary | ICD-10-CM

## 2018-02-19 DIAGNOSIS — R29898 Other symptoms and signs involving the musculoskeletal system: Secondary | ICD-10-CM

## 2018-02-19 DIAGNOSIS — R262 Difficulty in walking, not elsewhere classified: Secondary | ICD-10-CM

## 2018-02-19 NOTE — Telephone Encounter (Signed)
Rx diclofenac sent to pt pharmacy. 

## 2018-02-19 NOTE — Therapy (Signed)
[facility] [place] [address]  [address] [place], [place], [address] Phone: [phone]   Fax:  [phone]  Physical Therapy Treatment  Patient Details  Name: Clayton Stafford MRN: [id number] Date of Birth: 02/29/1991 Referring Provider (PT): Norton BlizzardShane Hudnall, MD   Encounter Date: 02/19/2018  PT End of Session - 02/19/18 1454    Visit Number  4    Number of Visits  13    Date for PT Re-Evaluation  03/16/18    Authorization Type  BCBS; VL: 30    Authorization - Visit Number  10    Authorization - Number of Visits  30    PT Start Time  1447    PT Stop Time  1525    PT Time Calculation (min)  38 min    Activity Tolerance  Patient tolerated treatment well    Behavior During Therapy  [facility] for tasks assessed/performed       Past Medical History:  Diagnosis Date  . ADHD   . Anxiety   . Bipolar 1 disorder (HCC)   . Bipolar 1 disorder (HCC)   . Bipolar 1 disorder (HCC)   . Depression     No past surgical history on file.  There were no vitals filed for this visit.  Subjective Assessment - 02/19/18 1453    Subjective  Pt. reporting no recent R foot pain over the past week.      Pertinent History  anxiety, depression, bipolar disorder, ADHD    Diagnostic tests  01/15/18 R foot xray: no acute abnormality noted     Patient Stated Goals  "my body to get better at stretching and get back in shape"    Currently in Pain?  No/denies    Pain Score  0-No pain    Multiple Pain Sites  No                       [facility] Adult PT Treatment/Exercise - 02/19/18 1459      Knee/Hip Exercises: Stretches   Passive Hamstring Stretch  Right;Left;2 reps;Limitations;20 seconds   pt. with difficulty holding for constant stretch    Passive Hamstring Stretch Limitations  supine strap    Quad Stretch  Right;1 rep;60 seconds    Quad Stretch Limitations  bolster under thigh     Gastroc Stretch  Right;60 seconds;2 reps    Gastroc  Stretch Limitations  strap supine and standing into wall       Knee/Hip Exercises: Aerobic   Recumbent Bike  Lvl 3, 6 min       Knee/Hip Exercises: Machines for Strengthening   Cybex Knee Flexion  B LE's 25# x 15 reps; B con/R ecc 20# x 10 reps       Knee/Hip Exercises: Standing   Hip Flexion  Right;Left;10 reps    Hip Flexion Limitations  red TB at ankle    Hip ADduction  Right;Left;10 reps;Strengthening    Hip ADduction Limitations  red TB at ankle    Hip Abduction  Right;Left    Forward Step Up  Right;10 reps;Step Height: 8";Hand Hold: 1    Forward Step Up Limitations  Cues required for slow movement pattern       Knee/Hip Exercises: Supine   Bridges with Newman PiesBall Squeeze  Both;Strengthening;15 reps   5' hold      Knee/Hip Exercises: Prone   Other Prone Exercises  Prone plank 2 x 15"  Ankle Exercises: Stretches   Gastroc Stretch  1 rep;60 seconds    Gastroc Stretch Limitations  Prostretch                PT Short Term Goals - 02/16/18 1504      PT SHORT TERM GOAL #1   Title  Patient to be independent with initial HEP.    Time  3    Period  Weeks    Status  Achieved        PT Long Term Goals - 02/10/18 1501      PT LONG TERM GOAL #1   Title  Patient to be independent with advanced HEP.    Time  6    Period  Weeks    Status  On-going      PT LONG TERM GOAL #2   Title  Patient to demonstrate [facility] and pain-free R ankle AROM.     Time  6    Period  Weeks    Status  On-going      PT LONG TERM GOAL #3   Title  Patient to demonstrate B LE strength >=4+/5.    Time  6    Period  Weeks    Status  On-going      PT LONG TERM GOAL #4   Title  Patient to report tolerance of a full day on his feet without pain limiting.     Time  6    Period  Weeks    Status  On-going            Plan - 02/19/18 1507    Clinical Impression Statement  Clayton Stafford reporting R ankle/foot has not bothered him in ~ two weeks.  Reports knee pain has resolved.  Pt. able to  tolerance all standing LE strengthening activities well today in session.  Still requiring some cueing for proper pacing and to stay on task today in session.  Ended visit pain free.  Will continue to progress toward goals.      Rehab Potential  Good    PT Frequency  2x / week    PT Duration  6 weeks    PT Treatment/Interventions  ADLs/Self Care Home Management;Cryotherapy;Electrical Stimulation;Iontophoresis 4mg /ml Dexamethasone;Moist Heat;Ultrasound;DME Instruction;Gait training;Stair training;Functional mobility training;Therapeutic activities;Therapeutic exercise;Manual techniques;Patient/family education;Orthotic Fit/Training;Neuromuscular re-education;Balance training;Passive range of motion;Dry needling;Energy conservation;Splinting;Taping;Vasopneumatic Device    PT Next Visit Plan  Target remaining LE strength deficits; monitor response to therex progression    Consulted and Agree with Plan of Care  Patient       Patient will benefit from skilled therapeutic intervention in order to improve the following deficits and impairments:  Increased edema, Decreased activity tolerance, Decreased strength, Pain, Difficulty walking, Decreased balance, Decreased range of motion, Postural dysfunction, Impaired flexibility  Visit Diagnosis: Pain in right foot  Other symptoms and signs involving the musculoskeletal system  Difficulty in walking, not elsewhere classified     Problem List Patient Active Problem List   Diagnosis Date Noted  . Bipolar disorder in partial remission (HCC) 12/27/2015  . Family history of heart attack 12/27/2015  . Substance induced mood disorder (HCC) 09/05/2014  . Polysubstance dependence (HCC) 09/05/2014    Kermit BaloMicah Schylar Allard, PTA 02/19/18 4:29 PM   [facility] [place] [address]  [address] [place], [place], [address] Phone: [phone]   Fax:  [phone]  Name: Clayton Stafford MRN: [id number] Date of  Birth: 02/05/1991

## 2018-02-20 ENCOUNTER — Ambulatory Visit: Payer: BLUE CROSS/BLUE SHIELD | Admitting: Medical

## 2018-02-20 DIAGNOSIS — Z0289 Encounter for other administrative examinations: Secondary | ICD-10-CM

## 2018-02-26 ENCOUNTER — Ambulatory Visit: Payer: BLUE CROSS/BLUE SHIELD | Admitting: Physical Therapy

## 2018-03-02 ENCOUNTER — Ambulatory Visit (INDEPENDENT_AMBULATORY_CARE_PROVIDER_SITE_OTHER): Payer: BLUE CROSS/BLUE SHIELD | Admitting: Family Medicine

## 2018-03-02 ENCOUNTER — Encounter: Payer: Self-pay | Admitting: Family Medicine

## 2018-03-02 VITALS — BP 153/96 | HR 78 | Ht 71.0 in | Wt 200.0 lb

## 2018-03-02 DIAGNOSIS — M25551 Pain in right hip: Secondary | ICD-10-CM | POA: Diagnosis not present

## 2018-03-02 NOTE — Progress Notes (Signed)
PCP: Mackie Pai, PA-C  Subjective:   HPI: Patient is a 27 y.o. male here for bilateral leg pain.  10/14: Patient reports overall he's improved from his myositis. He states he noticed pain in right dorsal foot about 2 weeks ago. No new injuries. Pain level 7/10 and sharp, worse with walking. He's been using salon pas, diclofenac, robaxin, gabapentin, topical biofreeze. No skin changes, numbness. Some pain up to posterior thigh.  11/14: Patient reports he's mildly improved compared to last visit. Wore boot only for a couple days - hurt worse to have this on. Taking diclofenac with gabapentin 800 tid, robaxin occasionally. Pain level 6/10 and sharp dorsal foot/ankle area. No skin changes, numbness.  12/30: Patient reports pain in his foot has resolved now. He gets some numbness, achiness bilateral thighs posteriorly only. Pain currently 0/10. No skin changes. No back pain. No longer taking robaxin or diclofenac.  Past Medical History:  Diagnosis Date  . ADHD   . Anxiety   . Bipolar 1 disorder ([place])   . Bipolar 1 disorder ([place])   . Bipolar 1 disorder ([place])   . Depression     Current Outpatient Medications on File Prior to Visit  Medication Sig Dispense Refill  . buprenorphine-naloxone (SUBOXONE) 8-2 mg SUBL SL tablet PLACE HALF A TAB UNDER TONGUE TWICE DAILY AND DISSOLVE. DO NOT EAT/DRINK FOR 20 MINUTES AFTER.  0  . cloNIDine (CATAPRES) 0.1 MG tablet Take 1 tablet (0.1 mg total) by mouth 3 (three) times daily. Must follow up in one month. (Patient not taking: Reported on 01/08/2018) 270 tablet 0  . diclofenac (VOLTAREN) 75 MG EC tablet TAKE 1 TABLET BY MOUTH TWICE A DAY (Patient not taking: Reported on 03/02/2018) 60 tablet 0  . doxycycline (VIBRA-TABS) 100 MG tablet Take 1 tablet (100 mg total) by mouth 2 (two) times daily. Can give caps or generic 14 tablet 0  . gabapentin (NEURONTIN) 800 MG tablet TAKE 1.5 TABLETS (1,200 MG TOTAL) BY MOUTH 3 (THREE) TIMES DAILY. 405  tablet 1  . methocarbamol (ROBAXIN) 500 MG tablet Take 1 tablet (500 mg total) by mouth every 8 (eight) hours as needed. (Patient not taking: Reported on 03/02/2018) 60 tablet 1  . NARCAN 4 MG/0.1ML LIQD nasal spray kit USE ONE TIME IF NEEDED (SPRAY INTO ONE NOSTRIL FOR EMERGENCY TREATMENT OF KNOWN OR SUSPECTED OPIATE OVERDOSE MAY REPEAT 2-3 MINUTES OPOSITE  5  . OXcarbazepine (TRILEPTAL) 150 MG tablet TAKE 1 TABLET BY MOUTH EVERY MORNING PLUS 2 TABS IN THE EVENING 90 tablet 2  . QUEtiapine (SEROQUEL) 200 MG tablet TAKE 1 TABLET BY MOUTH EVERY DAY AT BEDTIME 30 tablet 2   No current facility-administered medications on file prior to visit.     History reviewed. No pertinent surgical history.  No Known Allergies  Social History   Socioeconomic History  . Marital status: Single    Spouse name: Not on file  . Number of children: Not on file  . Years of education: Not on file  . Highest education level: Not on file  Occupational History  . Occupation: Environmental consultant at [employer]  . Financial resource strain: Not hard at all  . Food insecurity:    Worry: Never true    Inability: Never true  . Transportation needs:    Medical: No    Non-medical: No  Tobacco Use  . Smoking status: Current Every Day Smoker    Packs/day: 2.00    Types: Cigarettes  . Smokeless tobacco: Never Used  Substance and Sexual Activity  . Alcohol use: Yes    Alcohol/week: 2.0 standard drinks    Types: 2 Cans of beer per week    Comment: weekends  . Drug use: Yes    Types: IV, "Crack" cocaine, Cocaine, Heroin, Methamphetamines, Marijuana, LSD    Comment: patient in recovery at this time  . Sexual activity: Yes  Lifestyle  . Physical activity:    Days per week: 7 days    Minutes per session: 60 min  . Stress: Not on file  Relationships  . Social connections:    Talks on phone: Not on file    Gets together: Not on file    Attends religious service: Not on file    Active member of club or  organization: Not on file    Attends meetings of clubs or organizations: Not on file    Relationship status: Not on file  . Intimate partner violence:    Fear of current or ex partner: Not on file    Emotionally abused: Not on file    Physically abused: Not on file    Forced sexual activity: Not on file  Other Topics Concern  . Not on file  Social History Narrative  . Not on file    Family History  Problem Relation Age of Onset  . Depression Father   . Heart attack Father   . Hypertension Mother   . Bladder Cancer Paternal Grandfather     BP (!) 153/96   Pulse 78   Ht 5' 11"  (1.803 m)   Wt 200 lb (90.7 kg)   BMI 27.89 kg/m   Review of Systems: See HPI above.     Objective:  Physical Exam:  Gen: NAD, comfortable in exam room  Back: No gross deformity, scoliosis. No TTP .  No midline or bony TTP. FROM. Strength LEs 5/5 all muscle groups.   2+ MSRs in patellar and achilles tendons, equal bilaterally. Negative SLRs. Sensation intact to light touch bilaterally.  Bilateral hips: No deformity. FROM with 5/5 strength. No tenderness to palpation. NVI distally. Negative logroll bilateral hips Negative fabers and piriformis stretches.   Assessment & Plan:  1. Low back, leg pain - exam normal at this point, reassuring.  Finished with diclofenac and robaxin.  He will continue gabapentin - reports he believes he's taking it also for non-pain issues otherwise we discussed would recommend tapering this down.  He will discuss with psychiatry.  F/u with [place] prn otherwise.

## 2018-03-02 NOTE — Patient Instructions (Signed)
You're doing great. Speak to the psychiatrist about the gabapentin - since your pain is improved you could likely taper this off unless it's helping you with other conditions. Follow up with me as needed.

## 2018-03-06 ENCOUNTER — Encounter: Payer: Self-pay | Admitting: Medical

## 2018-03-06 ENCOUNTER — Telehealth: Payer: Self-pay | Admitting: Medical

## 2018-03-06 DIAGNOSIS — F3170 Bipolar disorder, currently in remission, most recent episode unspecified: Secondary | ICD-10-CM

## 2018-03-06 MED ORDER — QUETIAPINE FUMARATE 200 MG PO TABS: ORAL_TABLET | ORAL | 2 refills | Status: DC

## 2018-03-06 NOTE — Telephone Encounter (Signed)
RX refill of seroquel sent to pt pharmacy.

## 2018-03-10 ENCOUNTER — Ambulatory Visit: Payer: BLUE CROSS/BLUE SHIELD | Attending: Family Medicine

## 2018-03-10 DIAGNOSIS — R29898 Other symptoms and signs involving the musculoskeletal system: Secondary | ICD-10-CM | POA: Diagnosis present

## 2018-03-10 DIAGNOSIS — M79671 Pain in right foot: Secondary | ICD-10-CM | POA: Insufficient documentation

## 2018-03-10 DIAGNOSIS — R262 Difficulty in walking, not elsewhere classified: Secondary | ICD-10-CM | POA: Diagnosis present

## 2018-03-10 NOTE — Therapy (Addendum)
[facility] [place] [address]  [place], [place], [address] Phone: [phone]   Fax:  [phone]  Physical Therapy Treatment  Patient Details  Name: Clayton Stafford MRN: [id number] Date of Birth: 09-05-1990 Referring Provider (PT): Karlton Lemon, MD   Encounter Date: 03/10/2018  PT End of Session - 03/10/18 1447    Visit Number  5    Number of Visits  13    Date for PT Re-Evaluation  03/16/18    Authorization Type  BCBS; VL: 61    Authorization - Visit Number  11    Authorization - Number of Visits  30    PT Start Time  9233    PT Stop Time  1523    PT Time Calculation (min)  38 min    Activity Tolerance  Patient tolerated treatment well    Behavior During Therapy  [facility] for tasks assessed/performed       Past Medical History:  Diagnosis Date  . ADHD   . Anxiety   . Bipolar 1 disorder ([place])   . Bipolar 1 disorder ([place])   . Bipolar 1 disorder ([place])   . Depression     No past surgical history on file.  There were no vitals filed for this visit.  Subjective Assessment - 03/10/18 1447    Subjective  Pt. reporting he has not had R foot pain in 1 month.      Pertinent History  anxiety, depression, bipolar disorder, ADHD    How long can you sit comfortably?  unlimited    How long can you stand comfortably?  unlimited    How long can you walk comfortably?  unlimited    Diagnostic tests  01/15/18 R foot xray: no acute abnormality noted     Patient Stated Goals  "my body to get better at stretching and get back in shape"    Currently in Pain?  No/denies    Pain Score  0-No pain    Multiple Pain Sites  No         [facility] PT Assessment - 03/10/18 1452      AROM   Right/Left Ankle  Right;Left    Right Ankle Dorsiflexion  14    Right Ankle Plantar Flexion  55    Right Ankle Inversion  40    Right Ankle Eversion  30      Strength   Right/Left Ankle  Right;Left    Right Ankle Dorsiflexion  5/5    Right  Ankle Plantar Flexion  5/5    Right Ankle Inversion  5/5    Right Ankle Eversion  5/5    Left Ankle Dorsiflexion  5/5    Left Ankle Plantar Flexion  5/5    Left Ankle Inversion  4+/5    Left Ankle Eversion  4+/5                   [facility] Adult PT Treatment/Exercise - 03/10/18 1530      Self-Care   Self-Care  Other Self-Care Comments    Other Self-Care Comments   HEP review and update with additional calf stretching for hopeful improvement in DF ROM      Manual Therapy   Manual Therapy  Joint mobilization    Manual therapy comments  supine, prone      Joint Mobilization  R ankle anterior mobs with seat belt assistance pulling anterior at tibia x 1 min in R lunge position for  DF ROM     Passive ROM  R Gastroc stretching with therapist x 30 sec manually       Ankle Exercises: Seated   Heel Raises  --      Ankle Exercises: Standing   Vector Stance  Right;2 reps   x 30 sec - clocks    Heel Raises  Right;Left;15 reps    Heel Raises Limitations  Single leg       Ankle Exercises: Stretches   Soleus Stretch  3 reps;30 seconds    Soleus Stretch Limitations  leaning into wall     Gastroc Stretch  3 reps;30 seconds    Gastroc Stretch Limitations  leaning into wall       Ankle Exercises: Aerobic   Elliptical  Lvl 3.0, 6 min              PT Education - 03/10/18 1522    Education Details  HEP update    Person(s) Educated  Patient    Methods  Explanation;Demonstration;Verbal cues;Handout    Comprehension  Verbalized understanding;Returned demonstration;Verbal cues required;Need further instruction       PT Short Term Goals - 02/16/18 1504      PT SHORT TERM GOAL #1   Title  Patient to be independent with initial HEP.    Time  3    Period  Weeks    Status  Achieved        PT Long Term Goals - 03/10/18 1448      PT LONG TERM GOAL #1   Title  Patient to be independent with advanced HEP.    Time  6    Period  Weeks    Status  Achieved      PT LONG TERM  GOAL #2   Title  Patient to demonstrate [facility] and pain-free R ankle AROM.     Time  6    Period  Weeks    Status  Partially Met   Still somewhat limited R AROM DF at 14 dg likely due to remaining tightness in gastroc/soleus complex      PT LONG TERM GOAL #3   Title  Patient to demonstrate B LE strength >=4+/5.    Time  6    Period  Weeks    Status  Achieved      PT LONG TERM GOAL #4   Title  Patient to report tolerance of a full day on his feet without pain limiting.     Time  6    Period  Weeks    Status  Achieved            Plan - 03/10/18 1448    Clinical Impression Statement  Pt. reporting he has not had R foot/ankle pain in one month.  Able to achieve or partially achieve all LTG's in therapy today.  Pt. agreeable to 30-day hold from therapy and supervising PT in agreement with this as well.  HEP updated with additional calf stretches and manual R ankle joint mobs performed today with passive stretching performed for hopeful improvement in R ankle DF ROM.  Pt. encouraged to continue HEP activities at gym as he is regularly attending gym at this point.  Pt. now on 30-day hold with supervising PT notified of status and in agreement.      Rehab Potential  Good    PT Frequency  2x / week    PT Duration  6 weeks    PT Treatment/Interventions  ADLs/Self Care Home  Management;Cryotherapy;Electrical Stimulation;Iontophoresis 42m/ml Dexamethasone;Moist Heat;Ultrasound;DME Instruction;Gait training;Stair training;Functional mobility training;Therapeutic activities;Therapeutic exercise;Manual techniques;Patient/family education;Orthotic Fit/Training;Neuromuscular re-education;Balance training;Passive range of motion;Dry needling;Energy conservation;Splinting;Taping;Vasopneumatic Device    PT Next Visit Plan  Pt. on 30-day hold    Consulted and Agree with Plan of Care  Patient       Patient will benefit from skilled therapeutic intervention in order to improve the following deficits and  impairments:  Increased edema, Decreased activity tolerance, Decreased strength, Pain, Difficulty walking, Decreased balance, Decreased range of motion, Postural dysfunction, Impaired flexibility  Visit Diagnosis: Pain in right foot  Other symptoms and signs involving the musculoskeletal system  Difficulty in walking, not elsewhere classified     Problem List Patient Active Problem List   Diagnosis Date Noted  . Bipolar disorder in partial remission ([place] 12/27/2015  . Family history of heart attack 12/27/2015  . Substance induced mood disorder ([place] 09/05/2014  . Polysubstance dependence ([place] 09/05/2014    MBess Harvest PTA 03/10/18 3:42 PM    [place] [address] [place] [address]Phone: [phone]  Fax:  [phone] Name: Clayton CREASYMRN: [id number] of Birth: [birth year] PHYSICAL THERAPY DISCHARGE SUMMARY  Visits from Start of Care: 5  Current functional level related to goals / functional outcomes: See above clinical impression; patient did not return after 30 day hold   Remaining deficits: Decreased ankle ROM   Education / Equipment: HEP  Plan: Patient agrees to discharge.  Patient goals were partially met. Patient is being discharged due to being pleased with the current functional level.  ?????     YJanene Harvey PT, DPT 04/13/18 8:07 AM

## 2018-04-05 ENCOUNTER — Encounter (HOSPITAL_BASED_OUTPATIENT_CLINIC_OR_DEPARTMENT_OTHER): Payer: Self-pay | Admitting: Emergency Medicine

## 2018-04-05 ENCOUNTER — Other Ambulatory Visit: Payer: Self-pay

## 2018-04-05 ENCOUNTER — Emergency Department (HOSPITAL_BASED_OUTPATIENT_CLINIC_OR_DEPARTMENT_OTHER)
Admission: EM | Admit: 2018-04-05 | Discharge: 2018-04-05 | Disposition: A | Discharge status: Home / Self Care | Payer: BLUE CROSS/BLUE SHIELD | Attending: Emergency Medicine | Admitting: Emergency Medicine

## 2018-04-05 DIAGNOSIS — Z79899 Other long term (current) drug therapy: Secondary | ICD-10-CM | POA: Diagnosis not present

## 2018-04-05 DIAGNOSIS — L0211 Cutaneous abscess of neck: Secondary | ICD-10-CM | POA: Insufficient documentation

## 2018-04-05 DIAGNOSIS — L03221 Cellulitis of neck: Secondary | ICD-10-CM

## 2018-04-05 DIAGNOSIS — F17210 Nicotine dependence, cigarettes, uncomplicated: Secondary | ICD-10-CM | POA: Diagnosis not present

## 2018-04-05 DIAGNOSIS — R221 Localized swelling, mass and lump, neck: Secondary | ICD-10-CM | POA: Diagnosis present

## 2018-04-05 MED ORDER — CLINDAMYCIN HCL 300 MG PO CAPS: 300.0000 mg | ORAL_CAPSULE | Freq: Four times a day (QID) | ORAL | 0 refills | Status: DC

## 2018-04-05 MED ORDER — OXYCODONE-ACETAMINOPHEN 5-325 MG PO TABS
2.0000 | ORAL_TABLET | Freq: Once | ORAL | Status: AC
  Administered 2018-04-05: 2 via ORAL
  Filled 2018-04-05: qty 2

## 2018-04-05 MED ORDER — CLINDAMYCIN HCL 150 MG PO CAPS
300.0000 mg | ORAL_CAPSULE | Freq: Once | ORAL | Status: AC
  Administered 2018-04-05: 300 mg via ORAL
  Filled 2018-04-05: qty 2

## 2018-04-05 MED ORDER — LIDOCAINE HCL (PF) 1 % IJ SOLN
5.0000 mL | Freq: Once | INTRAMUSCULAR | Status: AC
  Administered 2018-04-05: 5 mL
  Filled 2018-04-05: qty 5

## 2018-04-05 NOTE — ED Notes (Signed)
Redness and swollen area to anterior right neck. States area was itching a few days ago but today was the first day he noticed the redness and swelling, NAD.

## 2018-04-05 NOTE — ED Notes (Signed)
Pt kept touching wound. Instructed pt to not touch wound and area needs to remain clean and dry. Wound re-cleaned at d/c home. Voiced understanding.

## 2018-04-05 NOTE — ED Notes (Signed)
Pt kept requesting pain medications for d/c home. Dr. Fredderick Phenix at bedside and instructed pt to take tylenol and ibuprofen for pain. Pt voiced understanding.

## 2018-04-05 NOTE — ED Triage Notes (Addendum)
Patient states that he has had pain and swelling to his right throat for the last 2 days - patient has a noted area of reddness and swelling to his throat that appears to be a cellulitis or abscess - patient denies any trouble swallowing or trouble breathing

## 2018-04-05 NOTE — ED Notes (Signed)
ED Provider at bedside. 

## 2018-04-05 NOTE — ED Provider Notes (Signed)
[facility] Provider Note   CSN: [id number] Arrival date & time: 04/05/18  1746     History   Chief Complaint Chief Complaint  Patient presents with  . Cellulitis    HPI [facility] is a 28 y.o. male.  Patient is a 28 year old male who presents with an infection to the right side of his neck.  He states that 2 days ago he noticed a pimple to his neck.  He tried to open it with a needle but was unsuccessful.  It is gotten increasingly red and swollen.  He denies any fevers.  No nausea or vomiting.  No difficulty swallowing.  No shortness of breath.     Past Medical History:  Diagnosis Date  . ADHD   . Anxiety   . Bipolar 1 disorder ([place])   . Bipolar 1 disorder ([place])   . Bipolar 1 disorder ([place])   . Depression     Patient Active Problem List   Diagnosis Date Noted  . Bipolar disorder in partial remission ([place]) 12/27/2015  . Family history of heart attack 12/27/2015  . Substance induced mood disorder ([place]) 09/05/2014  . Polysubstance dependence ([place]) 09/05/2014    History reviewed. No pertinent surgical history.      Home Medications    Prior to Admission medications   Medication Sig Start Date End Date Taking? Authorizing Provider  buprenorphine-naloxone (SUBOXONE) 8-2 mg SUBL SL tablet PLACE HALF A TAB UNDER TONGUE TWICE DAILY AND DISSOLVE. DO NOT EAT/DRINK FOR 20 MINUTES AFTER. 11/18/17   [provider]  clindamycin (CLEOCIN) 300 MG capsule Take 1 capsule (300 mg total) by mouth 4 (four) times daily. X 7 days 04/05/18   Malvin Johns, MD  cloNIDine (CATAPRES) 0.1 MG tablet Take 1 tablet (0.1 mg total) by mouth 3 (three) times daily. Must follow up in one month. Patient not taking: Reported on 01/08/2018 01/22/17   Emeterio Reeve, DO  diclofenac (VOLTAREN) 75 MG EC tablet TAKE 1 TABLET BY MOUTH TWICE A DAY Patient not taking: Reported on 03/02/2018 02/19/18   Saguier, Percell Miller, PA-C  doxycycline (VIBRA-TABS) 100  MG tablet Take 1 tablet (100 mg total) by mouth 2 (two) times daily. Can give caps or generic 01/08/18   Saguier, Percell Miller, PA-C  gabapentin (NEURONTIN) 800 MG tablet TAKE 1.5 TABLETS (1,200 MG TOTAL) BY MOUTH 3 (THREE) TIMES DAILY. 02/16/18   Hudnall, Sharyn Lull, MD  methocarbamol (ROBAXIN) 500 MG tablet Take 1 tablet (500 mg total) by mouth every 8 (eight) hours as needed. Patient not taking: Reported on 03/02/2018 11/26/17   Hudnall, Sharyn Lull, MD  NARCAN 4 MG/0.1ML LIQD nasal spray kit USE ONE TIME IF NEEDED (SPRAY INTO ONE NOSTRIL FOR EMERGENCY TREATMENT OF KNOWN OR SUSPECTED OPIATE OVERDOSE MAY REPEAT 2-3 MINUTES OPOSITE 09/30/17   [provider]  OXcarbazepine (TRILEPTAL) 150 MG tablet TAKE 1 TABLET BY MOUTH EVERY MORNING PLUS 2 TABS IN THE EVENING 02/06/18   Saguier, Percell Miller, PA-C  QUEtiapine (SEROQUEL) 200 MG tablet TAKE 1 TABLET BY MOUTH EVERY DAY AT BEDTIME 03/06/18   Saguier, Percell Miller, PA-C    Family History Family History  Problem Relation Age of Onset  . Depression Father   . Heart attack Father   . Hypertension Mother   . Bladder Cancer Paternal Grandfather     Social History Social History   Tobacco Use  . Smoking status: Current Every Day Smoker    Packs/day: 2.00    Types: Cigarettes  . Smokeless tobacco:  Never Used  Substance Use Topics  . Alcohol use: Yes    Alcohol/week: 2.0 standard drinks    Types: 2 Cans of beer per week    Comment: weekends  . Drug use: Yes    Types: IV, "Crack" cocaine, Cocaine, Heroin, Methamphetamines, Marijuana, LSD    Comment: patient in recovery at this time     Allergies   Patient has no known allergies.   Review of Systems Review of Systems  Constitutional: Negative for chills, diaphoresis, fatigue and fever.  HENT: Negative for congestion, rhinorrhea and sneezing.   Eyes: Negative.   Respiratory: Negative for cough, chest tightness and shortness of breath.   Cardiovascular: Negative for chest pain and leg swelling.    Gastrointestinal: Negative for abdominal pain, blood in stool, diarrhea, nausea and vomiting.  Genitourinary: Negative for difficulty urinating, flank pain, frequency and hematuria.  Musculoskeletal: Negative for arthralgias and back pain.  Skin: Positive for color change and wound. Negative for rash.  Neurological: Negative for dizziness, speech difficulty, weakness, numbness and headaches.     Physical Exam Updated Vital Signs BP 138/78 (BP Location: Right Arm)   Pulse 82   Temp 97.8 F (36.6 C) (Oral)   Resp 20   Ht 5' 11" (1.803 m)   Wt 90.7 kg   SpO2 99%   BMI 27.89 kg/m   Physical Exam Constitutional:      Appearance: He is well-developed.  HENT:     Head: Normocephalic and atraumatic.  Eyes:     Pupils: Pupils are equal, round, and reactive to light.  Neck:     Musculoskeletal: Normal range of motion and neck supple.     Comments: Patient has a 2 cm fluctuant abscess to his right mid neck.  It is freely mobile and does not appear to communicate with any deeper tissues.  There is a moderate amount of surrounding erythema. Cardiovascular:     Rate and Rhythm: Normal rate and regular rhythm.     Heart sounds: Normal heart sounds.  Pulmonary:     Effort: Pulmonary effort is normal. No respiratory distress.     Breath sounds: Normal breath sounds. No wheezing or rales.  Chest:     Chest wall: No tenderness.  Abdominal:     General: Bowel sounds are normal.     Palpations: Abdomen is soft.     Tenderness: There is no abdominal tenderness. There is no guarding or rebound.  Musculoskeletal: Normal range of motion.  Lymphadenopathy:     Cervical: No cervical adenopathy.  Skin:    General: Skin is warm and dry.     Findings: No rash.  Neurological:     Mental Status: He is alert and oriented to person, place, and time.      ED Treatments / Results  Labs (all labs ordered are listed, but only abnormal results are displayed) Labs Reviewed - No data to  display  EKG None  Radiology No results found.  Procedures .Marland KitchenIncision and Drainage Date/Time: 04/05/2018 10:28 PM Performed by: Malvin Johns, MD Authorized by: Malvin Johns, MD   Consent:    Consent obtained:  Verbal   Consent given by:  Patient   Risks discussed:  Bleeding, incomplete drainage, infection and pain   Alternatives discussed:  No treatment Location:    Type:  Abscess   Location:  Neck   Neck location:  R anterior Pre-procedure details:    Skin preparation:  Betadine Anesthesia (see MAR for exact dosages):  Anesthesia method:  Local infiltration   Local anesthetic:  Lidocaine 1% w/o epi Procedure type:    Complexity:  Simple Procedure details:    Incision types:  Single straight   Incision depth:  Dermal   Scalpel blade:  11   Wound management:  Probed and deloculated   Drainage:  Bloody   Drainage amount:  Scant   Wound treatment:  Wound left open   Packing materials:  None Post-procedure details:    Patient tolerance of procedure:  Tolerated well, no immediate complications   (including critical care time)  Medications Ordered in ED Medications  lidocaine (PF) (XYLOCAINE) 1 % injection 5 mL (has no administration in time range)  clindamycin (CLEOCIN) capsule 300 mg (has no administration in time range)  oxyCODONE-acetaminophen (PERCOCET/ROXICET) 5-325 MG per tablet 2 tablet (2 tablets Oral Given 04/05/18 2138)     Initial Impression / Assessment and Plan / ED Course  I have reviewed the triage vital signs and the nursing notes.  Pertinent labs & imaging results that were available during my care of the patient were reviewed by me and considered in my medical decision making (see chart for details).    Patient presents with an abscess to his right neck.  I did attempt an I&D but did not get much purulent drainage.  I will start him on antibiotics.  He was started on clindamycin.  He states his tetanus shot is up-to-date.  He was advised to use  warm compresses.  He was advised to follow-up with his PCP for recheck and was given return precautions.  He was not given prescription for opioid pain medication because he is currently on Suboxone and has a history of substance abuse.  He was advised use ibuprofen or Tylenol for symptomatic relief.  Final Clinical Impressions(s) / ED Diagnoses   Final diagnoses:  Cellulitis of neck    ED Discharge Orders         Ordered    clindamycin (CLEOCIN) 300 MG capsule  4 times daily     04/05/18 2229           Malvin Johns, MD 04/05/18 2230

## 2018-04-06 ENCOUNTER — Encounter (HOSPITAL_BASED_OUTPATIENT_CLINIC_OR_DEPARTMENT_OTHER): Payer: Self-pay

## 2018-04-06 ENCOUNTER — Ambulatory Visit: Payer: Self-pay | Admitting: Family Medicine

## 2018-04-06 ENCOUNTER — Emergency Department (HOSPITAL_BASED_OUTPATIENT_CLINIC_OR_DEPARTMENT_OTHER): Payer: BLUE CROSS/BLUE SHIELD

## 2018-04-06 ENCOUNTER — Emergency Department (HOSPITAL_BASED_OUTPATIENT_CLINIC_OR_DEPARTMENT_OTHER)
Admission: EM | Admit: 2018-04-06 | Discharge: 2018-04-06 | Disposition: A | Discharge status: Home / Self Care | Payer: BLUE CROSS/BLUE SHIELD | Attending: Emergency Medicine | Admitting: Emergency Medicine

## 2018-04-06 ENCOUNTER — Other Ambulatory Visit: Payer: Self-pay

## 2018-04-06 DIAGNOSIS — F17210 Nicotine dependence, cigarettes, uncomplicated: Secondary | ICD-10-CM | POA: Insufficient documentation

## 2018-04-06 DIAGNOSIS — Z79899 Other long term (current) drug therapy: Secondary | ICD-10-CM | POA: Diagnosis not present

## 2018-04-06 DIAGNOSIS — L03221 Cellulitis of neck: Secondary | ICD-10-CM

## 2018-04-06 DIAGNOSIS — R600 Localized edema: Secondary | ICD-10-CM | POA: Diagnosis present

## 2018-04-06 HISTORY — DX: Other psychoactive substance abuse, uncomplicated: F19.10

## 2018-04-06 LAB — CBC WITH DIFFERENTIAL/PLATELET
Abs Immature Granulocytes: 0.02 10*3/uL (ref 0.00–0.07)
Basophils Absolute: 0 10*3/uL (ref 0.0–0.1)
Basophils Relative: 0 %
Eosinophils Absolute: 0.1 10*3/uL (ref 0.0–0.5)
Eosinophils Relative: 1 %
HCT: 46.1 % (ref 39.0–52.0)
Hemoglobin: 14.9 g/dL (ref 13.0–17.0)
Immature Granulocytes: 0 %
Lymphocytes Relative: 24 %
Lymphs Abs: 2.8 10*3/uL (ref 0.7–4.0)
MCH: 28.6 pg (ref 26.0–34.0)
MCHC: 32.3 g/dL (ref 30.0–36.0)
MCV: 88.5 fL (ref 80.0–100.0)
Monocytes Absolute: 0.9 10*3/uL (ref 0.1–1.0)
Monocytes Relative: 8 %
NEUTROS PCT: 67 %
NRBC: 0 % (ref 0.0–0.2)
Neutro Abs: 7.8 10*3/uL — ABNORMAL HIGH (ref 1.7–7.7)
Platelets: 299 10*3/uL (ref 150–400)
RBC: 5.21 MIL/uL (ref 4.22–5.81)
RDW: 11.7 % (ref 11.5–15.5)
WBC: 11.7 10*3/uL — ABNORMAL HIGH (ref 4.0–10.5)

## 2018-04-06 LAB — BASIC METABOLIC PANEL
Anion gap: 6 (ref 5–15)
BUN: 8 mg/dL (ref 6–20)
CALCIUM: 7.6 mg/dL — AB (ref 8.9–10.3)
CO2: 24 mmol/L (ref 22–32)
Chloride: 105 mmol/L (ref 98–111)
Creatinine, Ser: 0.68 mg/dL (ref 0.61–1.24)
GFR calc Af Amer: >60 mL/min (ref >60)
GFR calc non Af Amer: >60 mL/min (ref >60)
Glucose, Bld: 83 mg/dL (ref 70–99)
Potassium: 2.8 mmol/L — ABNORMAL LOW (ref 3.5–5.1)
SODIUM: 135 mmol/L (ref 135–145)

## 2018-04-06 MED ORDER — OXYCODONE-ACETAMINOPHEN 5-325 MG PO TABS
1.0000 | ORAL_TABLET | Freq: Once | ORAL | Status: AC
  Administered 2018-04-06: 1 via ORAL
  Filled 2018-04-06: qty 1

## 2018-04-06 MED ORDER — IOPAMIDOL (ISOVUE-300) INJECTION 61%
100.0000 mL | Freq: Once | INTRAVENOUS | Status: AC | PRN
  Administered 2018-04-06: 75 mL via INTRAVENOUS

## 2018-04-06 MED FILL — CLINDAMYCIN HCL 300 MG CAPS: 300 | 7 days supply | Qty: 28 | Fill #0

## 2018-04-06 NOTE — ED Notes (Signed)
Pt requesting saboxone at this time. EDP updated and to see pt shortly.

## 2018-04-06 NOTE — ED Provider Notes (Signed)
[facility] DEPARTMENT Provider Note   CSN: [id number] Arrival date & time: 04/06/18  1750     History   Chief Complaint Chief Complaint  Patient presents with  . Abscess    HPI [facility] is a 28 y.o. male with a past medical history of substance abuse, bipolar disorder who presents to ED for worsening right neck redness and swelling.  He was seen 24 hours ago in this ED and found to have an abscess on the right neck which was drained without much purulent drainage.  He was started on clindamycin of which he has taken 2 doses of.  He continues to have increased redness, swelling.  Father was unsure if he was allergic to the antibiotic so he gave him 2 Benadryl.  He did not take any antipyretics prior to arrival.  Patient constantly asking for pain medication so is somewhat difficult to obtain history from him.  HPI  Past Medical History:  Diagnosis Date  . ADHD   . Anxiety   . Bipolar 1 disorder ([place])   . Bipolar 1 disorder ([place])   . Bipolar 1 disorder ([place])   . Depression   . Substance abuse [facility])     Patient Active Problem List   Diagnosis Date Noted  . Bipolar disorder in partial remission ([place]) 12/27/2015  . Family history of heart attack 12/27/2015  . Substance induced mood disorder ([place]) 09/05/2014  . Polysubstance dependence ([place]) 09/05/2014    History reviewed. No pertinent surgical history.      Home Medications    Prior to Admission medications   Medication Sig Start Date End Date Taking? Authorizing Provider  buprenorphine-naloxone (SUBOXONE) 8-2 mg SUBL SL tablet PLACE HALF A TAB UNDER TONGUE TWICE DAILY AND DISSOLVE. DO NOT EAT/DRINK FOR 20 MINUTES AFTER. 11/18/17   [provider]  clindamycin (CLEOCIN) 300 MG capsule Take 1 capsule (300 mg total) by mouth 4 (four) times daily. X 7 days 04/05/18   Malvin Johns, MD  cloNIDine (CATAPRES) 0.1 MG tablet Take 1 tablet (0.1 mg total) by mouth 3 (three) times daily. Must  follow up in one month. Patient not taking: Reported on 01/08/2018 01/22/17   Emeterio Reeve, DO  diclofenac (VOLTAREN) 75 MG EC tablet TAKE 1 TABLET BY MOUTH TWICE A DAY Patient not taking: Reported on 03/02/2018 02/19/18   Saguier, Percell Miller, PA-C  doxycycline (VIBRA-TABS) 100 MG tablet Take 1 tablet (100 mg total) by mouth 2 (two) times daily. Can give caps or generic 01/08/18   Saguier, Percell Miller, PA-C  gabapentin (NEURONTIN) 800 MG tablet TAKE 1.5 TABLETS (1,200 MG TOTAL) BY MOUTH 3 (THREE) TIMES DAILY. 02/16/18   Hudnall, Sharyn Lull, MD  methocarbamol (ROBAXIN) 500 MG tablet Take 1 tablet (500 mg total) by mouth every 8 (eight) hours as needed. Patient not taking: Reported on 03/02/2018 11/26/17   Hudnall, Sharyn Lull, MD  NARCAN 4 MG/0.1ML LIQD nasal spray kit USE ONE TIME IF NEEDED (SPRAY INTO ONE NOSTRIL FOR EMERGENCY TREATMENT OF KNOWN OR SUSPECTED OPIATE OVERDOSE MAY REPEAT 2-3 MINUTES OPOSITE 09/30/17   [provider]  OXcarbazepine (TRILEPTAL) 150 MG tablet TAKE 1 TABLET BY MOUTH EVERY MORNING PLUS 2 TABS IN THE EVENING 02/06/18   Saguier, Percell Miller, PA-C  QUEtiapine (SEROQUEL) 200 MG tablet TAKE 1 TABLET BY MOUTH EVERY DAY AT BEDTIME 03/06/18   Saguier, Percell Miller, PA-C    Family History Family History  Problem Relation Age of Onset  . Depression Father   . Heart attack  Father   . Hypertension Mother   . Bladder Cancer Paternal Grandfather     Social History Social History   Tobacco Use  . Smoking status: Current Every Day Smoker    Packs/day: 2.00    Types: Cigarettes  . Smokeless tobacco: Never Used  Substance Use Topics  . Alcohol use: Yes    Alcohol/week: 2.0 standard drinks    Types: 2 Cans of beer per week    Comment: weekends  . Drug use: Yes    Types: IV, "Crack" cocaine, Cocaine, Heroin, Methamphetamines, Marijuana, LSD    Comment: patient in recovery at this time     Allergies   Patient has no known allergies.   Review of Systems Review of Systems    Constitutional: Negative for appetite change, chills and fever.  HENT: Negative for ear pain, rhinorrhea, sneezing and sore throat.   Eyes: Negative for photophobia and visual disturbance.  Respiratory: Negative for cough, chest tightness, shortness of breath and wheezing.   Cardiovascular: Negative for chest pain and palpitations.  Gastrointestinal: Negative for abdominal pain, blood in stool, constipation, diarrhea, nausea and vomiting.  Genitourinary: Negative for dysuria, hematuria and urgency.  Musculoskeletal: Negative for myalgias.  Skin: Positive for color change. Negative for rash.  Neurological: Negative for dizziness, weakness and light-headedness.     Physical Exam Updated Vital Signs BP 135/85 (BP Location: Left Arm)   Pulse 94   Temp 98.2 F (36.8 C) (Oral)   Resp 18   Ht 5' 11"  (1.803 m)   Wt 90.7 kg   SpO2 99%   BMI 27.89 kg/m   Physical Exam Vitals signs and nursing note reviewed.  Constitutional:      General: He is not in acute distress.    Appearance: He is well-developed.     Comments: Speaking in complete sentence without difficulty.  No signs of respiratory distress or airway compromise.  HENT:     Head: Normocephalic and atraumatic.     Nose: Nose normal.  Eyes:     General: No scleral icterus.       Left eye: No discharge.     Conjunctiva/sclera: Conjunctivae normal.  Neck:     Musculoskeletal: Normal range of motion and neck supple.  Cardiovascular:     Rate and Rhythm: Normal rate and regular rhythm.     Heart sounds: Normal heart sounds. No murmur. No friction rub. No gallop.   Pulmonary:     Effort: Pulmonary effort is normal. No respiratory distress.     Breath sounds: Normal breath sounds.  Abdominal:     General: Bowel sounds are normal. There is no distension.     Palpations: Abdomen is soft.     Tenderness: There is no abdominal tenderness. There is no guarding.  Musculoskeletal: Normal range of motion.     Comments: Erythema  noted of the right anterior neck.  No crepitus palpated.  Skin:    General: Skin is warm and dry.     Findings: Erythema present. No rash.  Neurological:     Mental Status: He is alert.     Motor: No abnormal muscle tone.     Coordination: Coordination normal.        ED Treatments / Results  Labs (all labs ordered are listed, but only abnormal results are displayed) Labs Reviewed  CBC WITH DIFFERENTIAL/PLATELET - Abnormal; Notable for the following components:      Result Value   WBC 11.7 (*)    Neutro Abs 7.8 (*)  All other components within normal limits  BASIC METABOLIC PANEL - Abnormal; Notable for the following components:   Potassium 2.8 (*)    Calcium 7.6 (*)    All other components within normal limits  CULTURE, BLOOD (ROUTINE X 2)  CULTURE, BLOOD (ROUTINE X 2)    EKG None  Radiology Ct Soft Tissue Neck W Contrast  Result Date: 04/06/2018 CLINICAL DATA:  28 y/o M; neck abscess drained yesterday. Worsening cellulitis. EXAM: CT NECK WITH CONTRAST TECHNIQUE: Multidetector CT imaging of the neck was performed using the standard protocol following the bolus administration of intravenous contrast. CONTRAST:  101m ISOVUE-300 IOPAMIDOL (ISOVUE-300) INJECTION 61% COMPARISON:  10/29/2008 CT head FINDINGS: Pharynx and larynx: Normal. No mass or swelling. Salivary glands: No inflammation, mass, or stone. Thyroid: Normal. Lymph nodes: Mildly enlarged right greater than left cervical lymph nodes, likely reactive. No lymph node necrosis or abscess. Vascular: Negative. Limited intracranial: Negative. Visualized orbits: Negative. Mastoids and visualized paranasal sinuses: Clear. Skeleton: No acute or aggressive process. Upper chest: Negative. Other: Inflammation throughout the right greater than left anterior neck and face superficial soft tissues extending into the bilateral anterior cervical triangles, bilateral submandibular compartments, and the right parapharyngeal compartment. No  discrete rim enhancing collection identified. IMPRESSION: 1. Inflammation throughout the right greater than left anterior neck and face superficial soft tissues extending into the bilateral anterior cervical triangles, bilateral submandibular compartments, and right parapharyngeal compartment. No discrete rim enhancing collection identified to suggest abscess. 2. Mildly enlarged right greater than left cervical lymph nodes, likely reactive. Electronically Signed   By: LKristine GarbeM.D.   On: 04/06/2018 21:50    Procedures Procedures (including critical care time)  Medications Ordered in ED Medications  oxyCODONE-acetaminophen (PERCOCET/ROXICET) 5-325 MG per tablet 1 tablet (1 tablet Oral Given 04/06/18 2032)  iopamidol (ISOVUE-300) 61 % injection 100 mL (75 mLs Intravenous Contrast Given 04/06/18 2117)     Initial Impression / Assessment and Plan / ED Course  I have reviewed the triage vital signs and the nursing notes.  Pertinent labs & imaging results that were available during my care of the patient were reviewed by me and considered in my medical decision making (see chart for details).     28year old male presents to ED for wound recheck.  He was seen 24 hours ago with incision and drainage of his neck abscess.  He was given clindamycin.  He is back today because he had increased redness.  He is afebrile with no recent use of antipyretics.  Physical exam findings noted above.  There is no trismus, drooling or signs of respiratory distress.  CBC with mild leukocytosis of 11.7.  Hypokalemia at 2.8.  CT shows diffuse inflammation of the right anterior neck with no drainable abscess.  There is no crepitus noted on my exam.  No gas noted on CT.  Patient somewhat belligerent and aggressive during ED stay requesting pain medicine and his dose of Suboxone.  He does not want to stay to have potassium repleted.  I did inform him that worsening of his symptoms could happen on day 1 of  antibiotics.  We will have him follow-up with ENT as he should no improvement in the next 2 days.  Patient is agreeable to the plan.  Patient is hemodynamically stable, in NAD, and able to ambulate in the ED. Evaluation does not show pathology that would require ongoing emergent intervention or inpatient treatment. I explained the diagnosis to the patient. Pain has been managed and has no  complaints prior to discharge. Patient is comfortable with above plan and is stable for discharge at this time. All questions were answered prior to disposition. Strict return precautions for returning to the ED were discussed. Encouraged follow up with PCP.    Portions of this note were generated with [employer]. Dictation errors may occur despite best attempts at proofreading.   Final Clinical Impressions(s) / ED Diagnoses   Final diagnoses:  Cellulitis, neck    ED Discharge Orders    None       Delia Heady, PA-C 04/06/18 2234    Mesner, Corene Cornea, MD 04/07/18 [phone]

## 2018-04-06 NOTE — ED Notes (Signed)
Pt requesting more medication at this time. Pt also requesting for [place] to "do something about his neck". I informed pt that the EDP would be back in shortly as soon as the CT resulted. Pt agreeable to plan.

## 2018-04-06 NOTE — Discharge Instructions (Signed)
Continue taking your antibiotics as prescribed. It is not unusual for abscesses or cellulitis to worsen on the first day of antibiotics.  You should start to see improvement by day 3 of treatment. Follow-up with ENT specialist listed below. Return to ED for worsening symptoms, trouble breathing, trouble swallowing, increased redness or fevers.

## 2018-04-06 NOTE — ED Triage Notes (Signed)
Pt had an abscess on throat drained yesterday and states that he has increased redness and swelling to the area today, pt is drinking smoothie in triage, dad gave 2 benadryl prior to arrival

## 2018-04-06 NOTE — ED Notes (Addendum)
1st set of blood cultures obtained. 2nd stick to left [facility] with no success for blood draw. Pt refused to have additional stick at this time. Pt also requesting more percocet. Pt states "I normally take 10mg , this is just half". Informed I would reeval pain after the medication has time to work.

## 2018-04-06 NOTE — ED Notes (Signed)
Pt offered tylenol- states "I have that at home I need something stronger". Pt informed EDP would be in shortly to eval pt. Pt currently taking saboxone and in program.

## 2018-04-06 NOTE — ED Notes (Signed)
Pt using profanity towards EDP- in hallway- requested to return to room.
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
## 2018-04-11 LAB — CULTURE, BLOOD (ROUTINE X 2)
Culture: NO GROWTH
Special Requests: ADEQUATE

## 2018-04-14 ENCOUNTER — Encounter: Payer: Self-pay | Admitting: Medical

## 2018-04-14 ENCOUNTER — Ambulatory Visit: Payer: BLUE CROSS/BLUE SHIELD | Admitting: Medical

## 2018-04-14 ENCOUNTER — Telehealth: Payer: Self-pay

## 2018-04-14 VITALS — BP 142/82 | HR 99 | Temp 97.5°F | Resp 16 | Ht 71.0 in | Wt 198.8 lb

## 2018-04-14 DIAGNOSIS — E876 Hypokalemia: Secondary | ICD-10-CM

## 2018-04-14 DIAGNOSIS — F1920 Other psychoactive substance dependence, uncomplicated: Secondary | ICD-10-CM

## 2018-04-14 DIAGNOSIS — F3130 Bipolar disorder, current episode depressed, mild or moderate severity, unspecified: Secondary | ICD-10-CM

## 2018-04-14 DIAGNOSIS — L089 Local infection of the skin and subcutaneous tissue, unspecified: Secondary | ICD-10-CM

## 2018-04-14 DIAGNOSIS — D72829 Elevated white blood cell count, unspecified: Secondary | ICD-10-CM | POA: Diagnosis not present

## 2018-04-14 DIAGNOSIS — F419 Anxiety disorder, unspecified: Secondary | ICD-10-CM

## 2018-04-14 DIAGNOSIS — L0291 Cutaneous abscess, unspecified: Secondary | ICD-10-CM

## 2018-04-14 LAB — CBC
HCT: 44.4 % (ref 39.0–52.0)
HEMOGLOBIN: 15 g/dL (ref 13.0–17.0)
MCHC: 33.7 g/dL (ref 30.0–36.0)
MCV: 86.8 fl (ref 78.0–100.0)
Platelets: 404 10*3/uL — ABNORMAL HIGH (ref 150.0–400.0)
RBC: 5.12 Mil/uL (ref 4.22–5.81)
RDW: 12.6 % (ref 11.5–15.5)
WBC: 6.7 10*3/uL (ref 4.0–10.5)

## 2018-04-14 LAB — COMPREHENSIVE METABOLIC PANEL
ALBUMIN: 4.9 g/dL (ref 3.5–5.2)
ALT: 161 U/L — ABNORMAL HIGH (ref 0–53)
AST: 81 U/L — ABNORMAL HIGH (ref 0–37)
Alkaline Phosphatase: 69 U/L (ref 39–117)
BUN: 14 mg/dL (ref 6–23)
CO2: 30 mEq/L (ref 19–32)
Calcium: 9.9 mg/dL (ref 8.4–10.5)
Chloride: 99 mEq/L (ref 96–112)
Creatinine, Ser: 0.81 mg/dL (ref 0.40–1.50)
GFR: 114.43 mL/min (ref >60.00)
Glucose, Bld: 77 mg/dL (ref 70–99)
Potassium: 4.4 mEq/L (ref 3.5–5.1)
Sodium: 137 mEq/L (ref 135–145)
Total Bilirubin: 0.5 mg/dL (ref 0.2–1.2)
Total Protein: 7.8 g/dL (ref 6.0–8.3)

## 2018-04-14 LAB — TIQ- AMBIGUOUS ORDER: UNCLEAR ORDER:: 4456

## 2018-04-14 MED ORDER — CLINDAMYCIN HCL 300 MG PO CAPS: 300.0000 mg | ORAL_CAPSULE | Freq: Four times a day (QID) | ORAL | 0 refills | Status: DC

## 2018-04-14 MED ORDER — CEFTRIAXONE SODIUM 1 G IJ SOLR
1.0000 g | Freq: Once | INTRAMUSCULAR | Status: AC
  Administered 2018-04-14: 1 g via INTRAMUSCULAR

## 2018-04-14 NOTE — Telephone Encounter (Signed)
The emergency dept gave him clindaymyin 1 tab with sign 4 times a day. That is what my script said today. Only gave him 3 days or medicine pending wound culture. So not sure what is his question?

## 2018-04-14 NOTE — Telephone Encounter (Signed)
Copied from CRM [id number]. Topic: General - Other >> Apr 14, 2018 12:36 PM Mcneil, Ja-Kwan wrote: Reason for CRM: Pt called in for clarity on how he should be taking the clindamycin (CLEOCIN) 300 MG capsule. Pt stated the instructions are different on his paperwork and the pill bottle. Pt requests call back. Cb# [phone]

## 2018-04-14 NOTE — Progress Notes (Signed)
Subjective:    Patient ID: Clayton Stafford, male    DOB: 08-May-1990, 28 y.o.   MRN: [id number]  HPI Pt in for recent rt side neck  skin infection for more than a week. Area began to get red on 04/04/2017.  He had 2 visits to ED. Wbc count was mild elevated. No growth on blood culture. Pt states treatment in ED not successful.  Pt has been on clindamycin for 4 tabs a day.   Pt just squeezed the area and got discharge that was purulent.    Review of Systems  Respiratory: Negative for apnea, cough and wheezing.   Cardiovascular: Negative for chest pain and palpitations.  Gastrointestinal: Negative for abdominal pain.  Genitourinary: Negative for dysuria.  Musculoskeletal: Negative for arthralgias, gait problem and neck stiffness.  Skin: Negative for rash.       See hpi and physical exam.  Neurological: Negative for dizziness, seizures, syncope, facial asymmetry and weakness.  Hematological: Negative for adenopathy. Does not bruise/bleed easily.  Psychiatric/Behavioral: Negative for behavioral problems, dysphoric mood and suicidal ideas. The patient is nervous/anxious.      Past Medical History:  Diagnosis Date  . ADHD   . Anxiety   . Bipolar 1 disorder ([place])   . Bipolar 1 disorder ([place])   . Bipolar 1 disorder ([place])   . Depression   . Substance abuse ([place])      Social History   Socioeconomic History  . Marital status: Single    Spouse name: Not on file  . Number of children: Not on file  . Years of education: Not on file  . Highest education level: Not on file  Occupational History  . Occupation: Environmental consultant at [employer]  . Financial resource strain: Not hard at all  . Food insecurity:    Worry: Never true    Inability: Never true  . Transportation needs:    Medical: No    Non-medical: No  Tobacco Use  . Smoking status: Current Every Day Smoker    Packs/day: 2.00    Types: Cigarettes  . Smokeless tobacco: Never Used  Substance and Sexual Activity    . Alcohol use: Yes    Alcohol/week: 2.0 standard drinks    Types: 2 Cans of beer per week    Comment: weekends  . Drug use: Yes    Types: IV, "Crack" cocaine, Cocaine, Heroin, Methamphetamines, Marijuana, LSD    Comment: patient in recovery at this time  . Sexual activity: Yes  Lifestyle  . Physical activity:    Days per week: 7 days    Minutes per session: 60 min  . Stress: Not on file  Relationships  . Social connections:    Talks on phone: Not on file    Gets together: Not on file    Attends religious service: Not on file    Active member of club or organization: Not on file    Attends meetings of clubs or organizations: Not on file    Relationship status: Not on file  . Intimate partner violence:    Fear of current or ex partner: Not on file    Emotionally abused: Not on file    Physically abused: Not on file    Forced sexual activity: Not on file  Other Topics Concern  . Not on file  Social History Narrative  . Not on file    No past surgical history on file.  Family History  Problem Relation Age of Onset  .  Depression Father   . Heart attack Father   . Hypertension Mother   . Bladder Cancer Paternal Grandfather     No Known Allergies  Current Outpatient Medications on File Prior to Visit  Medication Sig Dispense Refill  . buprenorphine-naloxone (SUBOXONE) 8-2 mg SUBL SL tablet PLACE HALF A TAB UNDER TONGUE TWICE DAILY AND DISSOLVE. DO NOT EAT/DRINK FOR 20 MINUTES AFTER.  0  . clindamycin (CLEOCIN) 300 MG capsule Take 1 capsule (300 mg total) by mouth 4 (four) times daily. X 7 days 28 capsule 0  . cloNIDine (CATAPRES) 0.1 MG tablet Take 1 tablet (0.1 mg total) by mouth 3 (three) times daily. Must follow up in one month. 270 tablet 0  . diclofenac (VOLTAREN) 75 MG EC tablet TAKE 1 TABLET BY MOUTH TWICE A DAY 60 tablet 0  . doxycycline (VIBRA-TABS) 100 MG tablet Take 1 tablet (100 mg total) by mouth 2 (two) times daily. Can give caps or generic 14 tablet 0  .  gabapentin (NEURONTIN) 800 MG tablet TAKE 1.5 TABLETS (1,200 MG TOTAL) BY MOUTH 3 (THREE) TIMES DAILY. 405 tablet 1  . methocarbamol (ROBAXIN) 500 MG tablet Take 1 tablet (500 mg total) by mouth every 8 (eight) hours as needed. 60 tablet 1  . NARCAN 4 MG/0.1ML LIQD nasal spray kit USE ONE TIME IF NEEDED (SPRAY INTO ONE NOSTRIL FOR EMERGENCY TREATMENT OF KNOWN OR SUSPECTED OPIATE OVERDOSE MAY REPEAT 2-3 MINUTES OPOSITE  5  . OXcarbazepine (TRILEPTAL) 150 MG tablet TAKE 1 TABLET BY MOUTH EVERY MORNING PLUS 2 TABS IN THE EVENING 90 tablet 2  . QUEtiapine (SEROQUEL) 200 MG tablet TAKE 1 TABLET BY MOUTH EVERY DAY AT BEDTIME 30 tablet 2   No current facility-administered medications on file prior to visit.     BP (!) 142/82   Pulse 99   Temp (!) 97.5 F (36.4 C) (Oral)   Resp 16   Ht 5' 11"  (1.803 m)   Wt 198 lb 12.8 oz (90.2 kg)   SpO2 99%   BMI 27.73 kg/m       Objective:   Physical Exam  General Mental Status- Alert. General Appearance- Not in acute distress.   Skin General: Color- Normal Color. Moisture- Normal Moisture.  Neck Carotid Arteries- Normal color. Moisture- Normal Moisture. No carotid bruits. No JVD. From. No stiffness.  Rt side neck- 2.0-2.5 cm red, swollen area. Small 6 mm area in center that has slight serosanguinous drainage.  Chest and Lung Exam Auscultation: Breath Sounds:-Normal.  Cardiovascular Auscultation:Rythm- Regular. Murmurs & Other Heart Sounds:Auscultation of the heart reveals- No Murmurs.  Abdomen Inspection:-Inspeection Normal. Palpation/Percussion:Note:No mass. Palpation and Percussion of the abdomen reveal- Non Tender, Non Distended + BS, no rebound or guarding.  Neurologic Cranial Nerve exam:- CN III-XII intact(No nystagmus), symmetric smile. Strength:- 5/5 equal and symmetric strength both upper and lower extremities.      Assessment & Plan:  For your history of recent skin infection right side of neck that appears to have formed  small abscess, I do want to get you in with ENT.  Due to the location would prefer that specialist do the I&D.  You squeezed out some discharge last night and I want you to do warm salt water soaks twice daily with some gentle compression.  The area might spontaneously open up further and drain.  On the referral masking local ENT to see you by Friday.  We gave you Rocephin 1 g injection and I did fill additional 3 days of clindamycin.  If area worsens while he wait for ENT referral then recommend ED evaluation.  If you do have to be seen at the ED again would recommend the main ED at [facility] as if procedure needs to be done but emergency department staff not comfortable with location then they might call in specialist.  We will repeat your CBC today to recheck WBC count.  Also get metabolic panel to check your potassium which was low.  For your history of anxiety, did give you the list which has various psychiatrist.  Please go ahead and make that call.  Get a appointment date but then let me know who that psychiatrist is.  I could explain your history and see if I can get that appointment bumped up.  Follow-up date to be determined.  It was based on labs as well as when we can get you in with ENT.  40 minutes spent with pt. 50% of time spent with pt explaining treatment plan going forward persisting skin infection/abscess as well as other chronic medical condition.

## 2018-04-14 NOTE — Patient Instructions (Signed)
For your history of recent skin infection right side of neck that appears to have formed small abscess, I do want to get you in with ENT.  Due to the location would prefer that specialist do the I&D.  You squeezed out some discharge last night and I want you to do warm salt water soaks twice daily with some gentle compression.  The area might spontaneously open up further and drain.  On the referral masking local ENT to see you by Friday.  We gave you Rocephin 1 g injection and I did fill additional 3 days of clindamycin.  If area worsens while he wait for ENT referral then recommend ED evaluation.  If you do have to be seen at the ED again would recommend the main ED at [facility] as if procedure needs to be done but emergency department staff not comfortable with location then they might call in specialist.  We will repeat your CBC today to recheck WBC count.  Also get metabolic panel to check your potassium which was low.  For your history of anxiety, did give you the list which has various psychiatrist.  Please go ahead and make that call.  Get a appointment date but then let me know who that psychiatrist is.  I could explain your history and see if I can get that appointment bumped up.  Follow-up date to be determined.  It was based on labs as well as when we can get you in with ENT.

## 2018-04-15 ENCOUNTER — Telehealth: Payer: Self-pay | Admitting: Medical

## 2018-04-15 MED ORDER — CLINDAMYCIN HCL 300 MG PO CAPS: 300.0000 mg | ORAL_CAPSULE | Freq: Four times a day (QID) | ORAL | 0 refills | Status: DC

## 2018-04-15 NOTE — Telephone Encounter (Signed)
Resent rx clindamyin.

## 2018-04-15 NOTE — Telephone Encounter (Signed)
Made mistake on number of tabs. Sent additional nine tabs to his pharmacy.

## 2018-04-15 NOTE — Telephone Encounter (Signed)
Left pt a message to call back. Okay for PEC to give information.  

## 2018-04-15 NOTE — Telephone Encounter (Signed)
rx additional clindamycin sent.

## 2018-04-15 NOTE — Telephone Encounter (Signed)
Left message to call back regarding Rx.

## 2018-04-15 NOTE — Telephone Encounter (Signed)
Pt states he was only give 3 pills and instruction say take one tablet 4 times a day. Please advise.

## 2018-04-16 ENCOUNTER — Telehealth: Payer: Self-pay | Admitting: Medical

## 2018-04-16 NOTE — Telephone Encounter (Signed)
I sent staff message not asking for update on his referral. Would you please call ENT office that we sent referral to and get update. On the referral I asked if ent office could see him within 3 days. Please give me update.

## 2018-04-17 ENCOUNTER — Telehealth: Payer: Self-pay | Admitting: Medical

## 2018-04-17 LAB — WOUND CULTURE
MICRO NUMBER:: 179704
SPECIMEN QUALITY:: ADEQUATE

## 2018-04-17 MED ORDER — SULFAMETHOXAZOLE-TRIMETHOPRIM 800-160 MG PO TABS: 1.0000 | ORAL_TABLET | Freq: Two times a day (BID) | ORAL | 0 refills | Status: DC

## 2018-04-17 NOTE — Telephone Encounter (Signed)
Rx bactrim sent to pt pharmacy. 

## 2018-04-20 ENCOUNTER — Emergency Department (HOSPITAL_BASED_OUTPATIENT_CLINIC_OR_DEPARTMENT_OTHER)
Admission: EM | Admit: 2018-04-20 | Discharge: 2018-04-20 | Disposition: A | Discharge status: Home / Self Care | Payer: BLUE CROSS/BLUE SHIELD | Attending: Emergency Medicine | Admitting: Emergency Medicine

## 2018-04-20 ENCOUNTER — Other Ambulatory Visit: Payer: Self-pay

## 2018-04-20 ENCOUNTER — Encounter (HOSPITAL_BASED_OUTPATIENT_CLINIC_OR_DEPARTMENT_OTHER): Payer: Self-pay

## 2018-04-20 DIAGNOSIS — Z79899 Other long term (current) drug therapy: Secondary | ICD-10-CM | POA: Insufficient documentation

## 2018-04-20 DIAGNOSIS — F17210 Nicotine dependence, cigarettes, uncomplicated: Secondary | ICD-10-CM | POA: Diagnosis not present

## 2018-04-20 DIAGNOSIS — Y999 Unspecified external cause status: Secondary | ICD-10-CM | POA: Diagnosis not present

## 2018-04-20 DIAGNOSIS — Y93G1 Activity, food preparation and clean up: Secondary | ICD-10-CM | POA: Diagnosis not present

## 2018-04-20 DIAGNOSIS — F410 Panic disorder [episodic paroxysmal anxiety] without agoraphobia: Secondary | ICD-10-CM | POA: Diagnosis not present

## 2018-04-20 DIAGNOSIS — Y929 Unspecified place or not applicable: Secondary | ICD-10-CM | POA: Diagnosis not present

## 2018-04-20 DIAGNOSIS — S61216A Laceration without foreign body of right little finger without damage to nail, initial encounter: Secondary | ICD-10-CM | POA: Diagnosis present

## 2018-04-20 DIAGNOSIS — W260XXA Contact with knife, initial encounter: Secondary | ICD-10-CM | POA: Diagnosis not present

## 2018-04-20 MED ORDER — LORAZEPAM 1 MG PO TABS
ORAL_TABLET | ORAL | Status: AC
  Filled 2018-04-20: qty 1

## 2018-04-20 MED ORDER — LORAZEPAM 1 MG PO TABS
1.0000 mg | ORAL_TABLET | Freq: Once | ORAL | Status: AC
  Administered 2018-04-20: 1 mg via ORAL

## 2018-04-20 MED ORDER — LIDOCAINE HCL 1 % IJ SOLN
INTRAMUSCULAR | Status: AC
  Filled 2018-04-20: qty 20

## 2018-04-20 NOTE — ED Notes (Signed)
Pt wound no longer bleeding- lac approx 2cm long to 5th digit.

## 2018-04-20 NOTE — ED Provider Notes (Signed)
[facility] EMERGENCY DEPARTMENT Provider Note   CSN: [id number] Arrival date & time: 04/20/18  1854    History   Chief Complaint Chief Complaint  Patient presents with  . Finger Injury    HPI Clayton Stafford is a 28 y.o. male.     Patient is a 28 year old male who presents to the emergency department with a laceration to the right little finger. The patient states that he was cutting an apple and injured his right little finger.  The patient and family confirmed that the tetanus status is up-to-date.  The patient is not on any anticoagulation medications.  No other injury reported.  The history is provided by the patient and a parent.    Past Medical History:  Diagnosis Date  . ADHD   . Anxiety   . Bipolar 1 disorder ([place])   . Bipolar 1 disorder ([place])   . Bipolar 1 disorder ([place])   . Depression   . Substance abuse [facility])     Patient Active Problem List   Diagnosis Date Noted  . Bipolar disorder in partial remission ([place]) 12/27/2015  . Family history of heart attack 12/27/2015  . Substance induced mood disorder ([place]) 09/05/2014  . Polysubstance dependence ([place]) 09/05/2014    History reviewed. No pertinent surgical history.      Home Medications    Prior to Admission medications   Medication Sig Start Date End Date Taking? Authorizing Provider  buprenorphine-naloxone (SUBOXONE) 8-2 mg SUBL SL tablet PLACE HALF A TAB UNDER TONGUE TWICE DAILY AND DISSOLVE. DO NOT EAT/DRINK FOR 20 MINUTES AFTER. 11/18/17   [provider]  cloNIDine (CATAPRES) 0.1 MG tablet Take 1 tablet (0.1 mg total) by mouth 3 (three) times daily. Must follow up in one month. 01/22/17   Emeterio Reeve, DO  diclofenac (VOLTAREN) 75 MG EC tablet TAKE 1 TABLET BY MOUTH TWICE A DAY 02/19/18   Saguier, Percell Miller, PA-C  doxycycline (VIBRA-TABS) 100 MG tablet Take 1 tablet (100 mg total) by mouth 2 (two) times daily. Can give caps or generic 01/08/18   Saguier, Percell Miller, PA-C    gabapentin (NEURONTIN) 800 MG tablet TAKE 1.5 TABLETS (1,200 MG TOTAL) BY MOUTH 3 (THREE) TIMES DAILY. 02/16/18   Hudnall, Sharyn Lull, MD  methocarbamol (ROBAXIN) 500 MG tablet Take 1 tablet (500 mg total) by mouth every 8 (eight) hours as needed. 11/26/17   Hudnall, Sharyn Lull, MD  NARCAN 4 MG/0.1ML LIQD nasal spray kit USE ONE TIME IF NEEDED (SPRAY INTO ONE NOSTRIL FOR EMERGENCY TREATMENT OF KNOWN OR SUSPECTED OPIATE OVERDOSE MAY REPEAT 2-3 MINUTES OPOSITE 09/30/17   [provider]  OXcarbazepine (TRILEPTAL) 150 MG tablet TAKE 1 TABLET BY MOUTH EVERY MORNING PLUS 2 TABS IN THE EVENING 02/06/18   Saguier, Percell Miller, PA-C  QUEtiapine (SEROQUEL) 200 MG tablet TAKE 1 TABLET BY MOUTH EVERY DAY AT BEDTIME 03/06/18   Saguier, Percell Miller, PA-C  sulfamethoxazole-trimethoprim (BACTRIM DS,SEPTRA DS) 800-160 MG tablet Take 1 tablet by mouth 2 (two) times daily. 04/17/18   Saguier, Percell Miller, PA-C    Family History Family History  Problem Relation Age of Onset  . Depression Father   . Heart attack Father   . Hypertension Mother   . Bladder Cancer Paternal Grandfather     Social History Social History   Tobacco Use  . Smoking status: Current Every Day Smoker    Packs/day: 2.00    Types: Cigarettes  . Smokeless tobacco: Never Used  Substance Use Topics  . Alcohol use: Yes  Alcohol/week: 2.0 standard drinks    Types: 2 Cans of beer per week    Comment: weekly  . Drug use: Yes    Types: IV, "Crack" cocaine, Cocaine, Heroin, Methamphetamines, Marijuana, LSD    Comment: patient in recovery at this time     Allergies   Patient has no known allergies.   Review of Systems Review of Systems  Constitutional: Negative for activity change.       All ROS Neg except as noted in HPI  HENT: Negative for nosebleeds.   Eyes: Negative for photophobia and discharge.  Respiratory: Negative for cough, shortness of breath and wheezing.   Cardiovascular: Negative for chest pain and palpitations.   Gastrointestinal: Negative for abdominal pain and blood in stool.  Genitourinary: Negative for dysuria, frequency and hematuria.  Musculoskeletal: Negative for arthralgias, back pain and neck pain.  Skin: Negative.        Finger laceration  Neurological: Negative for dizziness, seizures and speech difficulty.  Psychiatric/Behavioral: Negative for confusion and hallucinations. The patient is nervous/anxious.      Physical Exam Updated Vital Signs BP (!) 147/102 (BP Location: Left Arm)   Pulse 92   Temp 97.7 F (36.5 C) (Oral)   Resp 20   SpO2 99%   Physical Exam Musculoskeletal:     Right hand: He exhibits laceration.       Hands:     Comments: The patient has a 1.3 cm laceration of the palmar surface of the little finger on the right hand.  There is no nail involvement.  There is no bone involvement.  The patient is very anxious, and difficult to carry out complete examination.  No tendon involvement noted on limited examination.  Capillary refill is less than 2 seconds.  Neurological:     Comments: Patient can flex and extend the finger with minimal problem.  No gross motor or sensory deficit appreciated.      ED Treatments / Results  Labs (all labs ordered are listed, but only abnormal results are displayed) Labs Reviewed - No data to display  EKG None  Radiology No results found.  Procedures .Marland KitchenLaceration Repair Date/Time: 04/20/2018 11:39 PM Performed by: Lily Kocher, PA-C Authorized by: Lily Kocher, PA-C   Consent:    Consent obtained:  Verbal   Consent given by:  Patient   Risks discussed:  Infection, pain, poor cosmetic result and poor wound healing Anesthesia (see MAR for exact dosages):    Anesthesia method:  Local infiltration   Local anesthetic:  Lidocaine 1% w/o epi Laceration details:    Location:  Finger   Finger location:  R small finger   Length (cm):  1.3 Repair type:    Repair type:  Simple Pre-procedure details:    Preparation:   Patient was prepped and draped in usual sterile fashion Exploration:    Hemostasis achieved with:  Direct pressure   Wound exploration: wound explored through full range of motion     Wound extent: no foreign bodies/material noted   Treatment:    Area cleansed with:  Betadine   Amount of cleaning:  Standard   Irrigation solution:  Sterile saline Skin repair:    Repair method:  Sutures   Suture size:  5-0   Wound skin closure material used: vicryl rapide.   Suture technique:  Simple interrupted   Number of sutures:  5 Approximation:    Approximation:  Close Post-procedure details:    Dressing:  Sterile dressing   Patient tolerance of procedure:  Tolerated well, no immediate complications   (including critical care time)  Medications Ordered in ED Medications  lidocaine (XYLOCAINE) 1 % (with pres) injection (has no administration in time range)  LORazepam (ATIVAN) tablet 1 mg (1 mg Oral Given 04/20/18 2300)     Initial Impression / Assessment and Plan / ED Course  I have reviewed the triage vital signs and the nursing notes.  Pertinent labs & imaging results that were available during my care of the patient were reviewed by me and considered in my medical decision making (see chart for details).          Final Clinical Impressions(s) / ED Diagnoses MDM  Vital signs reviewed.  Patient extremely anxious.  Patient states that he was having a panic attack while waiting for the examiner, as well as during the time of the examination.  Patient treated with Ativan 1 mg p.o.  The patient requested repeatedly to receive Percocet for pain.  I explained to the patient that he would be receiving lidocaine for the area.  The wound was cleansed thoroughly and then repaired with 5 interrupted sutures ofVicryl Rapide.  A dressing was applied.  The patient again asked for pain medication.  I advised the patient to use Tylenol every 4 hours or ibuprofen every 6 hours for soreness if  needed.  I informed the father that it appeared that the tendon function was well within normal limits, but to observe for any changes or problems, and to see the primary physician or return to the emergency department if any changes in this area.  Father is in agreement with this plan.   Final diagnoses:  Laceration of right little finger without foreign body without damage to nail, initial encounter  Anxiety attack    ED Discharge Orders    None       Lily Kocher, PA-C 04/21/18 1119    Charlesetta Shanks, MD 04/23/18 [phone]

## 2018-04-20 NOTE — ED Notes (Signed)
EDP at bedside  

## 2018-04-20 NOTE — ED Notes (Signed)
Pt informed ibuprofen/tylenol can be taken for pain. Pt slurring words. Pt visitor also appears intoxicated.

## 2018-04-20 NOTE — ED Notes (Signed)
New bandage applied. Sutures remain intact. Pt instructed to not take bandage off until 24hrs and to be gentle.

## 2018-04-20 NOTE — ED Triage Notes (Addendum)
Pt cut right pinky finger with kitchen knife while cutting an apple ~85min PTA ~1cm horizontal lac to anterior 5th finger-no bleeding-gauze,kling dsg applied-pt NAD-anxious-angry when advised of wait to be seen-steady gait-adult male with pt

## 2018-04-20 NOTE — ED Notes (Signed)
Pt has family at bedside

## 2018-04-20 NOTE — ED Notes (Signed)
Laceration across finger.  Minimal bleeding at this time.  Pressure dressing applied.  Pt walking around wildly saying he has to go back and be sutured right now.  Causing loud scene in [place].  Saying he does not want to lose his finger.  Assured by nurses that he has not lost excessive blood.  Security called.

## 2018-04-20 NOTE — ED Notes (Signed)
Pt pulled bandage off during check out- pt nodding on and off. Continuously asking for pain medication.

## 2018-04-20 NOTE — Discharge Instructions (Addendum)
Your laceration was repaired with dissolvable stitches.  These will come out in the next 7 or 8 days.  Please keep a bandage on the wound.  Keep the wound clean and dry.  Please see your primary physician or return to the emergency department if any signs of infection.  Use Tylenol every 4 hours or ibuprofen every 6 hours for soreness.

## 2018-04-28 ENCOUNTER — Telehealth: Payer: Self-pay

## 2018-04-28 NOTE — Telephone Encounter (Signed)
Copied from CRM [id number]. Topic: Referral - Request for Referral >> Apr 27, 2018  2:26 PM Baldo Daub L wrote: Has patient seen PCP for this complaint? yes *If NO, is insurance requiring patient see PCP for this issue before PCP can refer them? Referral for which specialty: psychiatrist Preferred provider/office: [facility] Reason for referral: needs a psychiatrist.  States they told him to have medications and referral faxed over to [phone].  Pt can be reached at [phone]

## 2018-04-30 ENCOUNTER — Telehealth: Payer: Self-pay | Admitting: Medical

## 2018-04-30 DIAGNOSIS — F319 Bipolar disorder, unspecified: Secondary | ICD-10-CM

## 2018-04-30 NOTE — Telephone Encounter (Signed)
Referral to psychiatrist placed.

## 2018-04-30 NOTE — Telephone Encounter (Signed)
New referral placed to psychiatrist,

## 2018-04-30 NOTE — Telephone Encounter (Signed)
Referral to psychiatrist placed. Can you call psychiatrist office and let them know I am asking if they could get him in within 2-4 weeks.

## 2018-05-04 ENCOUNTER — Other Ambulatory Visit: Payer: Self-pay | Admitting: Medical

## 2018-05-04 DIAGNOSIS — F3170 Bipolar disorder, currently in remission, most recent episode unspecified: Secondary | ICD-10-CM

## 2018-05-06 ENCOUNTER — Telehealth: Payer: Self-pay | Admitting: Medical

## 2018-05-06 NOTE — Telephone Encounter (Signed)
Tried to reach pt phone was disconnected. Okay for PEC to give information.

## 2018-05-06 NOTE — Telephone Encounter (Signed)
TC to patient. Reviewed Edward's message with him. Reports Psychiatrist office has not contacted him as of yet. The new referral was placed on 04/30/18 per chart note. He will phone back next week if he hasn't received a call from that office. He will pick up medications ordered, today.

## 2018-05-06 NOTE — Telephone Encounter (Signed)
I am refilling his psychiatric meds. Did he get appointment with psychiatrist. After months of waiting on him to initiate process I just put in referral after he contacted me that he called. Have they called him back?

## 2018-06-03 ENCOUNTER — Encounter: Payer: Self-pay | Admitting: Medical

## 2018-06-03 ENCOUNTER — Telehealth: Payer: Self-pay | Admitting: Medical

## 2018-06-03 NOTE — Telephone Encounter (Signed)
Dr. Laury Axon and Abner Greenspan,  I wanted your opinion on patient/situation.  He has a history of polysubstance abuse and was formally on Suboxone locally.  He wanted to get off of that as well as other drugs.  Local clinic would not take him off the Suboxone so apparently he recently went through detox in [place] and left there about 10 days ago.  He now wants to be on Vivitrol injections monthly per his my chart message.  Understand this is naltrexone.  I have never prescribed/given injections and believe the recommendation is that injections are given by healthcare provider.  Also understand there might be naltrexone tablets?  I am thinking he should contact former office who gave him the Suboxone and see if they would be willing to prescribe and administer vivitrol.  Patient also has bipolar disorder and he has yet to be established with a psychiatrist although I counseled him numerous times to set up with a psychiatrist and gave him list of psychiatrist numerous times.  Just wanted your advisement  Thanks,  Ramon Dredge

## 2018-06-03 NOTE — Telephone Encounter (Signed)
Psychiatry is using more [employer] now maybe he is willing to establish with them now but I have ont managed those shots previously either so do not feel we can manage this.

## 2018-06-03 NOTE — Telephone Encounter (Signed)
I would not feel comfortable with that -- esp injectons -- he needs to be in with a psych that does substance abuse  I just have never done this ---- stacey -- have you?

## 2018-06-03 NOTE — Telephone Encounter (Signed)
Sent message to pt via my chart explaining what Dr. Abner Greenspan and Dr. Laury Axon communicated to me.

## 2018-07-28 ENCOUNTER — Other Ambulatory Visit: Payer: Self-pay | Admitting: Family Medicine

## 2018-08-01 ENCOUNTER — Other Ambulatory Visit: Payer: Self-pay | Admitting: Medical

## 2018-08-01 DIAGNOSIS — F3170 Bipolar disorder, currently in remission, most recent episode unspecified: Secondary | ICD-10-CM

## 2018-08-05 ENCOUNTER — Other Ambulatory Visit: Payer: Self-pay

## 2018-08-05 ENCOUNTER — Ambulatory Visit: Payer: BC Managed Care – PPO | Admitting: Medical

## 2018-08-05 NOTE — Patient Instructions (Signed)
No charge. No visit.

## 2018-08-05 NOTE — Progress Notes (Signed)
   Subjective:    Patient ID: Clayton Stafford, male    DOB: 1991-01-14, 28 y.o.   MRN: [id number]  HPI Did not respond to call or virtual visit invite.   Review of Systems     Objective:   Physical Exam        Assessment & Plan:

## 2018-09-28 ENCOUNTER — Other Ambulatory Visit: Payer: Self-pay | Admitting: Medical

## 2018-09-28 ENCOUNTER — Telehealth: Payer: Self-pay | Admitting: Medical

## 2018-09-28 ENCOUNTER — Encounter: Payer: Self-pay | Admitting: Medical

## 2018-09-28 ENCOUNTER — Other Ambulatory Visit: Payer: Self-pay

## 2018-09-28 ENCOUNTER — Ambulatory Visit (INDEPENDENT_AMBULATORY_CARE_PROVIDER_SITE_OTHER): Payer: BC Managed Care – PPO | Admitting: Medical

## 2018-09-28 VITALS — BP 125/76 | HR 72 | Temp 98.0°F | Resp 16 | Ht 71.0 in | Wt 198.4 lb

## 2018-09-28 DIAGNOSIS — M79604 Pain in right leg: Secondary | ICD-10-CM

## 2018-09-28 DIAGNOSIS — L089 Local infection of the skin and subcutaneous tissue, unspecified: Secondary | ICD-10-CM | POA: Diagnosis not present

## 2018-09-28 DIAGNOSIS — F3170 Bipolar disorder, currently in remission, most recent episode unspecified: Secondary | ICD-10-CM

## 2018-09-28 MED ORDER — QUETIAPINE FUMARATE 200 MG PO TABS: ORAL_TABLET | ORAL | 2 refills | Status: DC

## 2018-09-28 MED ORDER — SULFAMETHOXAZOLE-TRIMETHOPRIM 800-160 MG PO TABS: 1.0000 | ORAL_TABLET | Freq: Two times a day (BID) | ORAL | 0 refills | Status: DC

## 2018-09-28 MED ORDER — GABAPENTIN 300 MG PO CAPS: ORAL_CAPSULE | ORAL | 0 refills | Status: DC

## 2018-09-28 NOTE — Telephone Encounter (Signed)
Pt needs to sign release of information so I can review his in patient substance abuse visit in [place] and [place]. Want to review how much gabpentin he was on.  He states he was on higher dose gabapentin up there? Reduced to 900 mg tid. Which is still on higher side. Please let him know I need to review records before next month refill. Gave him one month. So we need name and fax number of facility. Get him to sign release infor form if we don't have

## 2018-09-28 NOTE — Progress Notes (Signed)
buttock

## 2018-09-28 NOTE — Patient Instructions (Signed)
Patient today presents with possible early skin infection sacral dimple area and scattered folliculitis on his buttocks.  He does not have history of prior staph infections on the skin so I did prescribe him Bactrim DS.  He responded to this well in the past.  Did not do any culture since there is no wound or discharge are present.  History of lower extremity neuropathy for which he has taken gabapentin.  He has history of being on high doses of this and recently states that rehab doctor in [place] decreased his dose down to gabapentin 900 mg 3 times daily.  I do not have those records presently.  I did really fill his gabapentin but only for 1 month.  I sent message to medical assistant Delana Meyer today asking if she could notify patient to sign a release of record form so we can get records from the [place] [facility] as well as [place] [facility].  Patient also has history of bipolar/mood disorder.  Presently he appears stable.  I did refill his Seroquel as I have concern that without medication he would not do well.  I again asked him if he had schedule appointment with psychiatrist as I had asked him to do various times in the past and he has not done so.  Again reminded him that I want him to establish psychiatric care.  He made mention that going to rehab was barrier and him eventually seen local psychiatrist.  Follow up in 2 weeks or as needed

## 2018-09-28 NOTE — Telephone Encounter (Signed)
Opened to review 

## 2018-09-28 NOTE — Telephone Encounter (Signed)
Checked with pharmacy. No abnormality noted on review of rx with pharmacist.

## 2018-09-28 NOTE — Progress Notes (Signed)
Subjective:    Patient ID: Clayton Stafford, male    DOB: 1990-08-27, 28 y.o.   MRN: [id number]  HPI  I got to pt room within 7 minutes of his appointment. He needed to pick up his child so visit more on brief side although could have been longer in light of fact he has hx of substance abuse and just got disharged from recent inpatient facility within past week. Past 3 months in programs. One in [place] and second one in [place]. He had relapse after [place] that lead to treatment in [place].  Pt in for evaluation for skin issue. He has some redness of sacral dimple area with some follicles on buttox that are flared. Pt has hx of skin infections in the past.  No fever, no chills or sweats.  Pt need gabapentin for neuropathy. Pt states Dr.in [place] decrease his dose to 900 mg tid. He was on higher dose in [place]. I don't have records from [place].  Also he has hx of mood disorder. He is on triileptal and seroquel. More month if not longer I have asked him repeatedly to establish care with psychiatrist locally. I put in referral to facilitate the referral. He has not arranged to see one yet as I had asked.     Review of Systems  Constitutional: Negative for chills, fatigue and fever.  HENT: Negative for congestion and hearing loss.   Respiratory: Negative for chest tightness, shortness of breath and wheezing.   Cardiovascular: Negative for chest pain and palpitations.  Gastrointestinal: Negative for abdominal pain.  Musculoskeletal: Negative for back pain.  Skin: Positive for rash.  Neurological: Negative for dizziness, seizures, weakness and headaches.       Neuropathic pain in lower ext. Addressed on prior visits.  Hematological: Negative for adenopathy. Does not bruise/bleed easily.  Psychiatric/Behavioral: Negative for behavioral problems, confusion, dysphoric mood and suicidal ideas. The patient is not nervous/anxious.        Mood appears stable today.    Past Medical  History:  Diagnosis Date  . ADHD   . Anxiety   . Bipolar 1 disorder ([place])   . Bipolar 1 disorder ([place])   . Bipolar 1 disorder ([place])   . Depression   . Substance abuse ([place])      Social History   Socioeconomic History  . Marital status: Single    Spouse name: Not on file  . Number of children: Not on file  . Years of education: Not on file  . Highest education level: Not on file  Occupational History  . Occupation: Environmental consultant at [employer]  . Financial resource strain: Not hard at all  . Food insecurity    Worry: Never true    Inability: Never true  . Transportation Stafford    Medical: No    Non-medical: No  Tobacco Use  . Smoking status: Current Every Day Smoker    Packs/day: 2.00    Types: Cigarettes  . Smokeless tobacco: Never Used  Substance and Sexual Activity  . Alcohol use: Yes    Alcohol/week: 2.0 standard drinks    Types: 2 Cans of beer per week    Comment: weekly  . Drug use: Yes    Types: IV, "Crack" cocaine, Cocaine, Heroin, Methamphetamines, Marijuana, LSD    Comment: patient in recovery at this time  . Sexual activity: Not on file  Lifestyle  . Physical activity    Days per week: 7 days  Minutes per session: 60 min  . Stress: Not on file  Relationships  . Social Herbalist on phone: Not on file    Gets together: Not on file    Attends religious service: Not on file    Active member of club or organization: Not on file    Attends meetings of clubs or organizations: Not on file    Relationship status: Not on file  . Intimate partner violence    Fear of current or ex partner: Not on file    Emotionally abused: Not on file    Physically abused: Not on file    Forced sexual activity: Not on file  Other Topics Concern  . Not on file  Social History Narrative  . Not on file    No past surgical history on file.  Family History  Problem Relation Age of Onset  . Depression Father   . Heart attack Father   . Hypertension  Mother   . Bladder Cancer Paternal Grandfather     No Known Allergies  Current Outpatient Medications on File Prior to Visit  Medication Sig Dispense Refill  . buprenorphine-naloxone (SUBOXONE) 8-2 mg SUBL SL tablet PLACE HALF A TAB UNDER TONGUE TWICE DAILY AND DISSOLVE. DO NOT EAT/DRINK FOR 20 MINUTES AFTER.  0  . cloNIDine (CATAPRES) 0.1 MG tablet Take 1 tablet (0.1 mg total) by mouth 3 (three) times daily. Must follow up in one month. 270 tablet 0  . diclofenac (VOLTAREN) 75 MG EC tablet TAKE 1 TABLET BY MOUTH TWICE A DAY 60 tablet 0  . methocarbamol (ROBAXIN) 500 MG tablet Take 1 tablet (500 mg total) by mouth every 8 (eight) hours as needed. 60 tablet 1  . NARCAN 4 MG/0.1ML LIQD nasal spray kit USE ONE TIME IF NEEDED (SPRAY INTO ONE NOSTRIL FOR EMERGENCY TREATMENT OF KNOWN OR SUSPECTED OPIATE OVERDOSE MAY REPEAT 2-3 MINUTES OPOSITE  5  . OXcarbazepine (TRILEPTAL) 150 MG tablet TAKE 1 TABLET BY MOUTH EVERY MORNING PLUS 2 TABS IN THE EVENING 270 tablet 0   No current facility-administered medications on file prior to visit.     BP 125/76   Pulse 72   Temp 98 F (36.7 C) (Oral)   Resp 16   Ht _0  (1.803 m)   Wt 198 lb 6.4 oz (90 kg)   SpO2 98%   BMI 27.67 kg/m       Objective:   Physical Exam  General- No acute distress. Pleasant patient. Neck- Full range of motion, no jvd Lungs- Clear, even and unlabored. Heart- regular rate and rhythm. Neurologic- CNII- XII grossly intact.  Skin-in patient's sacral dimple area he has a slight reddish-pink rash.  Area is not swollen and had no fluctuance.  On palpation no severe tenderness.  On upper buttocks both sides he does have scattered follicles mild inflamed.  No severe swelling in sacral dimple area that would indicate pilonidal cyst presently.        Assessment & Plan:  Patient today presents with possible early skin infection sacral dimple area and scattered folliculitis on his buttocks.  He does not have history of  prior staph infections on the skin so I did prescribe him Bactrim DS.  He responded to this well in the past.  Did not do any culture since there is no wound or discharge are present.  History of lower extremity neuropathy for which he has taken gabapentin.  He has history of being on high doses of  this and recently states that rehab doctor in [place] decreased his dose down to gabapentin 900 mg 3 times daily.  I do not have those records presently.  I did really fill his gabapentin but only for 1 month.  I sent message to medical assistant Delana Meyer today asking if she could notify patient to sign a release of record form so we can get records from the [place] [facility] as well as [place] [facility].  Patient also has history of bipolar/mood disorder.  Presently he appears stable.  I did refill his Seroquel as I have concern that without medication he would not do well.  I again asked him if he had schedule appointment with psychiatrist as I had asked him to do various times in the past and he has not done so.  Again reminded him that I want him to establish psychiatric care.  He made mention that going to rehab was barrier and him eventually seen local psychiatrist.  Follow up in 2 weeks or as needed  [employer]

## 2018-09-30 NOTE — Telephone Encounter (Signed)
Left pt a message to came in to sign record release forms.

## 2018-10-14 ENCOUNTER — Telehealth: Payer: Self-pay | Admitting: Medical

## 2018-10-14 DIAGNOSIS — F3170 Bipolar disorder, currently in remission, most recent episode unspecified: Secondary | ICD-10-CM

## 2018-10-14 NOTE — Telephone Encounter (Signed)
Medication Refill - Medication:   OXcarbazepine (TRILEPTAL) 150 MG tablet  QUEtiapine (SEROQUEL) 200 MG tablet Propanolol 20mg (Prescribed during Rehab) Fluoxitine 50mg  (Prescribed during Rehab) Gabapentin 3 x daily (Prescribed during Rehab)  Preferred Pharmacy:  [id number] [place], [address] [phone] (Fax)   Pt was advised he may need appt to get refills originally prescribed by different provider.  Pt expressed understanding.   Pt was advised that RX refills may take up to 3 business days. We ask that you follow-up with your pharmacy.

## 2018-10-16 ENCOUNTER — Telehealth: Payer: Self-pay | Admitting: Medical

## 2018-10-16 MED ORDER — QUETIAPINE FUMARATE 200 MG PO TABS: ORAL_TABLET | ORAL | 2 refills | Status: DC

## 2018-10-16 MED ORDER — GABAPENTIN 300 MG PO CAPS: ORAL_CAPSULE | ORAL | 0 refills | Status: DC

## 2018-10-16 MED ORDER — OXCARBAZEPINE 150 MG PO TABS: ORAL_TABLET | ORAL | 0 refills | Status: DC

## 2018-10-16 NOTE — Telephone Encounter (Signed)
I refilled his trileptal, seroquel and gabapentin. Will you clarify with pt dosing of fluoxetine and propanolol. This was prescribed by rehab. Want to review those instructions  Also ask remind pt that I still want him to get in with psychiatrist as I had advised him numerous times in past.  Also have him follow up telephone visit within next 7-10 days.  Sendng this to you since Lacey won't be in on Monday. I thinks you will be working with me on Monday?

## 2018-10-16 NOTE — Telephone Encounter (Signed)
Rod Holler, you are with edward on monday

## 2018-10-16 NOTE — Telephone Encounter (Signed)
Opened to review 

## 2018-10-21 NOTE — Telephone Encounter (Signed)
lvm for patient to call and schedule virtual visit with Percell Miller this week

## 2018-10-26 ENCOUNTER — Telehealth: Payer: Self-pay | Admitting: *Deleted

## 2018-10-26 NOTE — Telephone Encounter (Signed)
Who Is Calling Patient / Member / Family / Caregiver Caller Name Shannen Vernon Caller Phone Number [phone] Patient Name Clayton Stafford Patient DOB 12-28-1990 Call Type Message Only Information Provided Reason for Call Request to Schedule Office Appointment Initial Comment Caller states he has severe anxiety and would like to schedule an appointment. Additional Comment Caller states he will call back during business hours.  Call Closed By: Jamal Maes Transaction Date/Time: 10/25/2018 3:00:20 PM (ET)

## 2018-10-26 NOTE — Telephone Encounter (Signed)
Lvm patient advised to call for follow up appointmen

## 2018-10-26 NOTE — Telephone Encounter (Signed)
No appointment made so far.  Called to schedule appointment but left message on machine to call back to schedule appointment

## 2018-10-26 NOTE — Telephone Encounter (Signed)
Mychart message ent to patient

## 2018-10-28 ENCOUNTER — Other Ambulatory Visit: Payer: Self-pay

## 2018-10-29 ENCOUNTER — Ambulatory Visit: Payer: BC Managed Care – PPO | Admitting: Medical

## 2018-10-30 ENCOUNTER — Encounter: Payer: Self-pay | Admitting: Medical

## 2018-10-30 ENCOUNTER — Other Ambulatory Visit: Payer: Self-pay

## 2018-10-30 ENCOUNTER — Ambulatory Visit (INDEPENDENT_AMBULATORY_CARE_PROVIDER_SITE_OTHER): Payer: BC Managed Care – PPO | Admitting: Medical

## 2018-10-30 VITALS — BP 138/79 | HR 88 | Temp 98.2°F | Resp 16 | Ht 71.0 in | Wt 197.8 lb

## 2018-10-30 DIAGNOSIS — F1920 Other psychoactive substance dependence, uncomplicated: Secondary | ICD-10-CM

## 2018-10-30 DIAGNOSIS — F419 Anxiety disorder, unspecified: Secondary | ICD-10-CM | POA: Diagnosis not present

## 2018-10-30 DIAGNOSIS — F3170 Bipolar disorder, currently in remission, most recent episode unspecified: Secondary | ICD-10-CM

## 2018-10-30 MED ORDER — BUSPIRONE HCL 7.5 MG PO TABS: ORAL_TABLET | ORAL | 0 refills | Status: DC

## 2018-10-30 NOTE — Progress Notes (Signed)
Subjective:    Patient ID: Clayton Stafford, male    DOB: 08/09/1990, 28 y.o.   MRN: [id number]  HPI  Pt in for follow up.  Pt has been reporting some severe anxiety. He states his dad has severe anxiety as well. He also has substance abuse hx and some depression as well. He has been to rehab varius times in the past.  I had been trying to refer to psychiatric various times in the past. I have put referral in and given him information but he never followed through.  Pt on gabapentin 800 mg bid recently. That was for nerve pain.  Pt is on seroquel and trileptal. I refilled this/kept him on in the past as he came to our office on these medications.  Pt was off of suboxone since march.   He states has tried a lot of medications for anxiety in the past. But can't specify other than xanax. He states benzo worked for him in the past.    Review of Systems  Constitutional: Negative for chills, fatigue and fever.  HENT: Negative for congestion.   Respiratory: Negative for chest tightness, shortness of breath and wheezing.   Cardiovascular: Negative for chest pain.  Gastrointestinal: Negative for abdominal pain.  Genitourinary: Negative for dysuria.  Musculoskeletal: Negative for gait problem, myalgias and neck stiffness.  Skin: Negative for rash and wound.  Neurological: Negative for dizziness, tremors, light-headedness and headaches.  Hematological: Negative for adenopathy. Does not bruise/bleed easily.  Psychiatric/Behavioral: Positive for dysphoric mood. Negative for behavioral problems, decreased concentration, sleep disturbance and suicidal ideas. The patient is nervous/anxious.     Past Medical History:  Diagnosis Date  . ADHD   . Anxiety   . Bipolar 1 disorder ([place])   . Bipolar 1 disorder ([place])   . Bipolar 1 disorder ([place])   . Depression   . Substance abuse ([place])      Social History   Socioeconomic History  . Marital status: Single    Spouse name: Not on file  .  Number of children: Not on file  . Years of education: Not on file  . Highest education level: Not on file  Occupational History  . Occupation: Environmental consultant at [employer]  . Financial resource strain: Not hard at all  . Food insecurity    Worry: Never true    Inability: Never true  . Transportation needs    Medical: No    Non-medical: No  Tobacco Use  . Smoking status: Current Every Day Smoker    Packs/day: 2.00    Types: Cigarettes  . Smokeless tobacco: Never Used  Substance and Sexual Activity  . Alcohol use: Yes    Alcohol/week: 2.0 standard drinks    Types: 2 Cans of beer per week    Comment: weekly  . Drug use: Yes    Types: IV, "Crack" cocaine, Cocaine, Heroin, Methamphetamines, Marijuana, LSD    Comment: patient in recovery at this time  . Sexual activity: Not on file  Lifestyle  . Physical activity    Days per week: 7 days    Minutes per session: 60 min  . Stress: Not on file  Relationships  . Social Herbalist on phone: Not on file    Gets together: Not on file    Attends religious service: Not on file    Active member of club or organization: Not on file    Attends meetings of clubs or organizations: Not on  file    Relationship status: Not on file  . Intimate partner violence    Fear of current or ex partner: Not on file    Emotionally abused: Not on file    Physically abused: Not on file    Forced sexual activity: Not on file  Other Topics Concern  . Not on file  Social History Narrative  . Not on file    No past surgical history on file.  Family History  Problem Relation Age of Onset  . Depression Father   . Heart attack Father   . Hypertension Mother   . Bladder Cancer Paternal Grandfather     No Known Allergies  Current Outpatient Medications on File Prior to Visit  Medication Sig Dispense Refill  . buprenorphine-naloxone (SUBOXONE) 8-2 mg SUBL SL tablet PLACE HALF A TAB UNDER TONGUE TWICE DAILY AND DISSOLVE. DO NOT  EAT/DRINK FOR 20 MINUTES AFTER.  0  . cloNIDine (CATAPRES) 0.1 MG tablet Take 1 tablet (0.1 mg total) by mouth 3 (three) times daily. Must follow up in one month. 270 tablet 0  . diclofenac (VOLTAREN) 75 MG EC tablet TAKE 1 TABLET BY MOUTH TWICE A DAY 60 tablet 0  . gabapentin (NEURONTIN) 300 MG capsule 3 tab po tid (Patient taking differently: 800 mg 2 (two) times daily. ) 270 capsule 0  . methocarbamol (ROBAXIN) 500 MG tablet Take 1 tablet (500 mg total) by mouth every 8 (eight) hours as needed. 60 tablet 1  . NARCAN 4 MG/0.1ML LIQD nasal spray kit USE ONE TIME IF NEEDED (SPRAY INTO ONE NOSTRIL FOR EMERGENCY TREATMENT OF KNOWN OR SUSPECTED OPIATE OVERDOSE MAY REPEAT 2-3 MINUTES OPOSITE  5  . OXcarbazepine (TRILEPTAL) 150 MG tablet 1 tab po am and 2 tab at night 270 tablet 0  . QUEtiapine (SEROQUEL) 200 MG tablet TAKE 1 TABLET BY MOUTH EVERY DAY AT BEDTIME 30 tablet 2  . sulfamethoxazole-trimethoprim (BACTRIM DS) 800-160 MG tablet Take 1 tablet by mouth 2 (two) times daily. 20 tablet 0   No current facility-administered medications on file prior to visit.     BP 138/79   Pulse 88   Temp 98.2 F (36.8 C) (Temporal)   Resp 16   Ht 5' 11" (1.803 m)   Wt 197 lb 12.8 oz (89.7 kg)   SpO2 98%   BMI 27.59 kg/m       Objective:   Physical Exam  General Mental Status- Alert. General Appearance- Not in acute distress.   Chest and Lung Exam Auscultation: Breath Sounds:-Normal.  Cardiovascular Auscultation:Rythm- Regular. Murmurs & Other Heart Sounds:Auscultation of the heart reveals- No Murmurs.   Neurologic Cranial Nerve exam:- CN III-XII intact(No nystagmus), symmetric smile.       Assessment & Plan:  For your anxiety will give print rx buspar to see if this helps. Start low dose 7.5 but can take max 15 mg if needed twice daily.  Need to see if Dr. Blyth willing to ok use of benzo in light of your med history.  Continue other meds same.  Will mail you information on  psychiatrist offices. Please call them as requested in past.  Follow up 10-14 days or as needed   

## 2018-10-30 NOTE — Patient Instructions (Addendum)
For your anxiety will give print rx buspar to see if this helps. Start low dose 7.5 mg  but can take max 15 mg if needed twice daily.  Need to see if Dr. Charlett Blake willing to ok use of benzo in light of your med history.  Continue other meds same.  Will mail you information on psychiatrist offices. Please call them as requested in past.  Follow up 10-14 days or as needed

## 2018-10-31 ENCOUNTER — Telehealth: Payer: Self-pay | Admitting: Medical

## 2018-10-31 NOTE — Telephone Encounter (Signed)
Would you mail pt list of psychiatrist for him to call and establish care. We have given this information before but he did not follow through as requested. Please document that information mailed.

## 2018-10-31 NOTE — Telephone Encounter (Signed)
Dr. Charlett Blake,  I wanted your opinion on this pt whose parents see you. Pt has hx of major depression and polysubstance abuse. He has been in and out of rehab intermittently every since he began seeing me.   When he first began to see me his mother had expressed she wanted you to be his pcp. Early on I touched base with you on his first visit. I have been prescribing his psychiatric medications which he was on before he ever saw me. I have asked him numerous time to establish care with psychiatrist as I explained with his history I thought would be best. I put in referral but he was non compliant on doing his part on referral.   Now he has some severe anxiety and is requesting xanax or other benzo. I had hoped for him to establish psychiatrist care to avoid this scenario. I explained I don't feel like it is good idea in light of his polysubstance abuse history. I offered high dose buspar or hydroxyzine. He states previously used both and he felt was not adequate.   I would not write benzo unless you agreed. Wanted to ask you as you are pcp for other family members. I sent you not but wanted to give more in depth explanation.  Thanks,  Percell Miller

## 2018-11-01 ENCOUNTER — Telehealth: Payer: Self-pay | Admitting: Medical

## 2018-11-01 NOTE — Telephone Encounter (Signed)
Would you let pt know that I did pass message to Dr. Charlett Blake about him requesting xanax or other benzo for anxiety. Dr. Charlett Blake not willing to prescribe and agrees with my  psychiatrist referral particularly if he feels like he needs benzo.  So please mail him list and confirm address. Document we sent out through mail.   Also when pt first got established with me his  mom wanted him to be pt of Dr. Charlett Blake. I don't know what pt preference is but if he wants to be pt of Dr. Charlett Blake she will agree to be his pcp but needs to see psychiatrist first

## 2018-11-01 NOTE — Telephone Encounter (Signed)
Would you let pt know that I did pass message to Dr. Blyth about him requesting xanax or other benzo for anxiety. Dr. Blyth not willing to prescribe and agrees with my  psychiatrist referral particularly if he feels like he needs benzo.  So please mail him list and confirm address. Document we sent out through mail.   Also when pt first got established with me his  mom wanted him to be pt of Dr. Blyth. I don't know what pt preference is but if he wants to be pt of Dr. Blyth she will agree to be his pcp but needs to see psychiatrist first 

## 2018-11-01 NOTE — Telephone Encounter (Signed)
I am inclined to agree with you. He should see psychiatry to manage his anxiety especially if he feels he needs a benzo. They can make that call. I am still willing to take him on but I will say only if he sees psychiatry. Our licenses' are too at risk any other way.

## 2018-11-03 NOTE — Telephone Encounter (Signed)
Left pt a message to call back. Mailed list of psychiatrist. Faythe Ghee for [facility] to give information.

## 2018-11-11 ENCOUNTER — Other Ambulatory Visit: Payer: Self-pay | Admitting: Medical

## 2018-11-11 NOTE — Telephone Encounter (Signed)
[facility]16/2018. Pharmacy state that it was to early to fill at that time. They are getting refill ready for patient

## 2018-11-11 NOTE — Telephone Encounter (Signed)
Patient disconnected before receiving more information.

## 2018-11-11 NOTE — Telephone Encounter (Signed)
Medication Refill - Medication:  gabapentin (NEURONTIN) 300 MG capsule  Has the patient contacted their pharmacy?  Yes advised to call PCP.  Preferred Pharmacy (with phone number or street name):  [id number] [place], [place] [address] [phone] (Phone) [phone] (Fax)   Agent: Please be advised that RX refills may take up to 3 business days. We ask that you follow-up with your pharmacy.

## 2018-11-11 NOTE — Telephone Encounter (Signed)
Patient called in stating he still has not received list of psychiatrist and would like to swing by office to pick up. Please advise.

## 2018-11-13 NOTE — Telephone Encounter (Signed)
Spoke with pt. Pt will try to come by today to pick up list.

## 2018-11-16 ENCOUNTER — Other Ambulatory Visit: Payer: Self-pay | Admitting: Medical

## 2018-11-16 DIAGNOSIS — F3170 Bipolar disorder, currently in remission, most recent episode unspecified: Secondary | ICD-10-CM

## 2018-11-16 NOTE — Telephone Encounter (Signed)
Pt is requesting 90 day supply for quetiapine. Please advise.

## 2018-11-17 MED ORDER — QUETIAPINE FUMARATE 200 MG PO TABS: ORAL_TABLET | ORAL | 2 refills | Status: DC

## 2018-11-17 NOTE — Telephone Encounter (Signed)
Has pt picked up the list of psychiatrist. I don't typically write depression meds for more than 30 days at a time unless pt extremely stable and known to be doing well for long time. Very rarely do that.   So prefer that he just continue with 30 tab rx.   Sent in 30 tab rx with refills.

## 2018-11-19 NOTE — Telephone Encounter (Signed)
Im not sure if he has pick up list yet. Pt did state he would come by to pick up list.

## 2018-12-05 ENCOUNTER — Emergency Department (HOSPITAL_BASED_OUTPATIENT_CLINIC_OR_DEPARTMENT_OTHER)
Admission: EM | Admit: 2018-12-05 | Discharge: 2018-12-05 | Disposition: A | Discharge status: Home / Self Care | Payer: BC Managed Care – PPO | Attending: Emergency Medicine | Admitting: Emergency Medicine

## 2018-12-05 ENCOUNTER — Encounter (HOSPITAL_BASED_OUTPATIENT_CLINIC_OR_DEPARTMENT_OTHER): Payer: Self-pay | Admitting: Emergency Medicine

## 2018-12-05 ENCOUNTER — Other Ambulatory Visit: Payer: Self-pay

## 2018-12-05 DIAGNOSIS — F17210 Nicotine dependence, cigarettes, uncomplicated: Secondary | ICD-10-CM | POA: Diagnosis not present

## 2018-12-05 DIAGNOSIS — K0889 Other specified disorders of teeth and supporting structures: Secondary | ICD-10-CM

## 2018-12-05 DIAGNOSIS — Z79899 Other long term (current) drug therapy: Secondary | ICD-10-CM | POA: Insufficient documentation

## 2018-12-05 MED ORDER — NAPROXEN 500 MG PO TABS: ORAL_TABLET | ORAL | 0 refills | Status: DC

## 2018-12-05 MED ORDER — BUPIVACAINE-EPINEPHRINE (PF) 0.5% -1:200000 IJ SOLN
1.8000 mL | Freq: Once | INTRAMUSCULAR | Status: AC
  Administered 2018-12-05: 1.8 mL
  Filled 2018-12-05: qty 1.8

## 2018-12-05 MED ORDER — NAPROXEN 250 MG PO TABS
500.0000 mg | ORAL_TABLET | Freq: Once | ORAL | Status: AC
  Administered 2018-12-05: 06:00:00 500 mg via ORAL
  Filled 2018-12-05: qty 2

## 2018-12-05 MED ORDER — CLINDAMYCIN HCL 150 MG PO CAPS
300.0000 mg | ORAL_CAPSULE | Freq: Once | ORAL | Status: AC
  Administered 2018-12-05: 300 mg via ORAL
  Filled 2018-12-05: qty 2

## 2018-12-05 MED ORDER — CLINDAMYCIN HCL 300 MG PO CAPS: 300.0000 mg | ORAL_CAPSULE | Freq: Four times a day (QID) | ORAL | 0 refills | Status: DC

## 2018-12-05 NOTE — ED Provider Notes (Signed)
[facility] DEPT [facility] Provider Note: Georgena Spurling, MD, FACEP  CSN: [id number] MRN: [id number] ARRIVAL: 12/05/18 at [place]: [facility]   HISTORY OF PRESENT ILLNESS  12/05/18 5:11 AM Clayton Stafford is a 28 y.o. male with a history of polysubstance abuse and was on Suboxone up through March of this year.    He is here with pain in his right upper third molar which she rates as a 10 out of 10.  It is been present for 3 days.  It has not been relieved with acetaminophen.  The pain radiates to his right jaw.  He describes the pain is aching in nature.    Past Medical History:  Diagnosis Date  . ADHD   . Anxiety   . Bipolar 1 disorder ([place])   . Depression   . Substance abuse ([place])     History reviewed. No pertinent surgical history.  Family History  Problem Relation Age of Onset  . Depression Father   . Heart attack Father   . Hypertension Mother   . Bladder Cancer Paternal Grandfather     Social History   Tobacco Use  . Smoking status: Current Every Day Smoker    Packs/day: 2.00    Types: Cigarettes  . Smokeless tobacco: Never Used  Substance Use Topics  . Alcohol use: Yes    Alcohol/week: 2.0 standard drinks    Types: 2 Cans of beer per week    Comment: weekly  . Drug use: Yes    Types: IV, "Crack" cocaine, Cocaine, Heroin, Methamphetamines, Marijuana, LSD    Comment: patient in recovery at this time    Prior to Admission medications   Medication Sig Start Date End Date Taking? Authorizing Provider  buprenorphine-naloxone (SUBOXONE) 8-2 mg SUBL SL tablet PLACE HALF A TAB UNDER TONGUE TWICE DAILY AND DISSOLVE. DO NOT EAT/DRINK FOR 20 MINUTES AFTER. 11/18/17   [provider]  busPIRone (BUSPAR) 7.5 MG tablet 1-2 tab po bid as needed 10/30/18   Saguier, Percell Miller, PA-C  cloNIDine (CATAPRES) 0.1 MG tablet Take 1 tablet (0.1 mg total) by mouth 3 (three) times daily. Must follow up in one month. 01/22/17   Emeterio Reeve, DO  diclofenac (VOLTAREN) 75 MG EC tablet TAKE 1 TABLET BY MOUTH TWICE A DAY 02/19/18   Saguier, Percell Miller, PA-C  gabapentin (NEURONTIN) 300 MG capsule 3 tab po tid Patient taking differently: 800 mg 2 (two) times daily.  10/16/18   Saguier, Percell Miller, PA-C  methocarbamol (ROBAXIN) 500 MG tablet Take 1 tablet (500 mg total) by mouth every 8 (eight) hours as needed. 11/26/17   Hudnall, Sharyn Lull, MD  NARCAN 4 MG/0.1ML LIQD nasal spray kit USE ONE TIME IF NEEDED (SPRAY INTO ONE NOSTRIL FOR EMERGENCY TREATMENT OF KNOWN OR SUSPECTED OPIATE OVERDOSE MAY REPEAT 2-3 MINUTES OPOSITE 09/30/17   [provider]  OXcarbazepine (TRILEPTAL) 150 MG tablet 1 tab po am and 2 tab at night 10/16/18   Saguier, Edward, PA-C  QUEtiapine (SEROQUEL) 200 MG tablet TAKE 1 TABLET BY MOUTH EVERY DAY AT BEDTIME 11/17/18   Saguier, Percell Miller, PA-C  sulfamethoxazole-trimethoprim (BACTRIM DS) 800-160 MG tablet Take 1 tablet by mouth 2 (two) times daily. 09/28/18   Saguier, Percell Miller, PA-C    Allergies Patient has no known allergies.   REVIEW OF SYSTEMS  Negative except as noted here or in the History of Present Illness.   PHYSICAL EXAMINATION  Initial Vital Signs Blood pressure (!) 156/78, pulse 76, temperature  97.9 F (36.6 C), resp. rate 20, height 5' 11" (1.803 m), weight 90.7 kg, SpO2 97 %.  Examination General: Well-developed, well-nourished male in no acute distress; appearance consistent with age of record HENT: normocephalic; atraumatic; fractured right upper third molar Eyes: pupils equal, round and reactive to light; extraocular muscles intact Neck: supple; no lymphadenopathy Heart: regular rate and rhythm; no murmurs, rubs or gallops Lungs: clear to auscultation bilaterally Abdomen: soft; nondistended Extremities: No deformity; full range of motion Neurologic: Awake, alert and oriented; motor function intact in all extremities and symmetric; no facial droop Skin: Warm and dry Psychiatric: Grimacing    RESULTS  Summary of this visit's results, reviewed by myself:   EKG Interpretation  Date/Time:    Ventricular Rate:    PR Interval:    QRS Duration:   QT Interval:    QTC Calculation:   R Axis:     Text Interpretation:        Laboratory Studies: No results found for this or any previous visit (from the past 24 hour(s)). Imaging Studies: No results found.  ED COURSE and MDM  Nursing notes and initial vitals signs, including pulse oximetry, reviewed.  Vitals:   12/05/18 0507 12/05/18 0508  BP: (!) 156/78   Pulse: 76   Resp: 20   Temp: 97.9 F (36.6 C)   SpO2: 97%   Weight: 90.7 kg 90.7 kg  Height: 5' 11" (1.803 m) 5' 11" (1.803 m)   It is unclear if the patient's fracture involves the pulp but we will start on clindamycin as a precaution and refer to dentistry.  Given the patient's history of polysubstance abuse including narcotics we will avoid narcotic analgesics.  PROCEDURES   DENTAL BLOCK 1.8 milliliters of 0.5% bupivacaine with epinephrine were injected into the buccal fold adjacent to the right upper third molar. The patient tolerated this well and there were no immediate complications. Adequate analgesia was obtained.   ED DIAGNOSES     ICD-10-CM   1. Pain, dental  K08.89        Shanon Rosser, MD 12/05/18 [phone]

## 2018-12-05 NOTE — ED Triage Notes (Signed)
Patient arrived via POV c/o dental pain. Patient states upper rear dental pain x 3 days. Patient states 10/10 pain. Patient states 1000 mg  Tylenol for pain x 1 hr prior. Patient is AO x 4, VS WDL, normal gait.

## 2018-12-19 ENCOUNTER — Other Ambulatory Visit: Payer: Self-pay | Admitting: Medical

## 2018-12-19 DIAGNOSIS — F3170 Bipolar disorder, currently in remission, most recent episode unspecified: Secondary | ICD-10-CM

## 2019-01-05 ENCOUNTER — Other Ambulatory Visit: Payer: Self-pay

## 2019-01-06 ENCOUNTER — Ambulatory Visit (INDEPENDENT_AMBULATORY_CARE_PROVIDER_SITE_OTHER): Payer: BC Managed Care – PPO | Admitting: Medical

## 2019-01-06 ENCOUNTER — Encounter: Payer: Self-pay | Admitting: Medical

## 2019-01-06 VITALS — BP 115/65 | HR 81 | Temp 98.4°F | Resp 16 | Ht 71.0 in | Wt 217.4 lb

## 2019-01-06 DIAGNOSIS — L089 Local infection of the skin and subcutaneous tissue, unspecified: Secondary | ICD-10-CM

## 2019-01-06 DIAGNOSIS — M79644 Pain in right finger(s): Secondary | ICD-10-CM | POA: Diagnosis not present

## 2019-01-06 LAB — CBC WITH DIFFERENTIAL/PLATELET
Basophils Absolute: 0.1 10*3/uL (ref 0.0–0.1)
Basophils Relative: 1.2 % (ref 0.0–3.0)
Eosinophils Absolute: 0.2 10*3/uL (ref 0.0–0.7)
Eosinophils Relative: 2.4 % (ref 0.0–5.0)
HCT: 44 % (ref 39.0–52.0)
Hemoglobin: 14.7 g/dL (ref 13.0–17.0)
Lymphocytes Relative: 43.1 % (ref 12.0–46.0)
Lymphs Abs: 2.9 10*3/uL (ref 0.7–4.0)
MCHC: 33.5 g/dL (ref 30.0–36.0)
MCV: 88.3 fl (ref 78.0–100.0)
Monocytes Absolute: 0.5 10*3/uL (ref 0.1–1.0)
Monocytes Relative: 7.9 % (ref 3.0–12.0)
Neutro Abs: 3.1 10*3/uL (ref 1.4–7.7)
Neutrophils Relative %: 45.4 % (ref 43.0–77.0)
Platelets: 374 10*3/uL (ref 150.0–400.0)
RBC: 4.99 Mil/uL (ref 4.22–5.81)
RDW: 13.1 % (ref 11.5–15.5)
WBC: 6.8 10*3/uL (ref 4.0–10.5)

## 2019-01-06 LAB — URIC ACID: Uric Acid, Serum: 4.1 mg/dL (ref 4.0–7.8)

## 2019-01-06 MED ORDER — KETOROLAC TROMETHAMINE 60 MG/2ML IM SOLN
60.0000 mg | Freq: Once | INTRAMUSCULAR | Status: AC
  Administered 2019-01-06: 60 mg via INTRAMUSCULAR

## 2019-01-06 MED ORDER — SULFAMETHOXAZOLE-TRIMETHOPRIM 800-160 MG PO TABS: 1.0000 | ORAL_TABLET | Freq: Two times a day (BID) | ORAL | 0 refills | Status: DC

## 2019-01-06 NOTE — Patient Instructions (Addendum)
For your rt index finger pain, I think you have skin infection vs possible gout. Will get xray of digit, cbc and uric acid.  We gave you toradol 60 mg im injection today. Please start bactrim DS antibiotic today.  If you have color changes to finger increased swelling of finger or swelling up arm then recommend ED evaluation.  Will follow up you lab and xray.update you.   If labs, xray negative but no changes to finger then may consider referral to specialist to evaluate flexor and extendor tendon.  Follow up Monday or as needed

## 2019-01-06 NOTE — Progress Notes (Signed)
Subjective:    Patient ID: Clayton Stafford, male    DOB: 1991/01/06, 28 y.o.   MRN: [id number]  HPI  Pt in with rt second digit pain that started 2 days ago. No trauma or fall. He states he first noticed that it was swollen and stuck in fixed position on Monday. But he states it was aching for about 2 weeks.   No fever, no chills or sweats. Pt is right handed.    Review of Systems  Constitutional: Negative for chills, fatigue and fever.  Respiratory: Negative for cough, chest tightness, shortness of breath and wheezing.   Cardiovascular: Negative for chest pain and palpitations.  Gastrointestinal: Negative for abdominal pain, constipation, diarrhea and nausea.  Musculoskeletal: Negative for back pain, joint swelling and neck pain.       Rt second digit pain.  Neurological: Negative for dizziness and headaches.  Hematological: Negative for adenopathy. Does not bruise/bleed easily.  Psychiatric/Behavioral: Negative for behavioral problems and confusion. The patient is not nervous/anxious.     Past Medical History:  Diagnosis Date  . ADHD   . Anxiety   . Bipolar 1 disorder ([place])   . Depression   . Substance abuse ([place])      Social History   Socioeconomic History  . Marital status: Single    Spouse name: Not on file  . Number of children: Not on file  . Years of education: Not on file  . Highest education level: Not on file  Occupational History  . Occupation: Environmental consultant at [employer]  . Financial resource strain: Not hard at all  . Food insecurity    Worry: Never true    Inability: Never true  . Transportation needs    Medical: No    Non-medical: No  Tobacco Use  . Smoking status: Current Every Day Smoker    Packs/day: 2.00    Types: Cigarettes  . Smokeless tobacco: Never Used  Substance and Sexual Activity  . Alcohol use: Yes    Alcohol/week: 2.0 standard drinks    Types: 2 Cans of beer per week    Comment: weekly  . Drug use: Yes    Types:  IV, "Crack" cocaine, Cocaine, Heroin, Methamphetamines, Marijuana, LSD    Comment: patient in recovery at this time  . Sexual activity: Not on file  Lifestyle  . Physical activity    Days per week: 7 days    Minutes per session: 60 min  . Stress: Not on file  Relationships  . Social Herbalist on phone: Not on file    Gets together: Not on file    Attends religious service: Not on file    Active member of club or organization: Not on file    Attends meetings of clubs or organizations: Not on file    Relationship status: Not on file  . Intimate partner violence    Fear of current or ex partner: Not on file    Emotionally abused: Not on file    Physically abused: Not on file    Forced sexual activity: Not on file  Other Topics Concern  . Not on file  Social History Narrative  . Not on file    No past surgical history on file.  Family History  Problem Relation Age of Onset  . Depression Father   . Heart attack Father   . Hypertension Mother   . Bladder Cancer Paternal Grandfather     No Known Allergies  Current Outpatient Medications on File Prior to Visit  Medication Sig Dispense Refill  . buprenorphine-naloxone (SUBOXONE) 8-2 mg SUBL SL tablet PLACE HALF A TAB UNDER TONGUE TWICE DAILY AND DISSOLVE. DO NOT EAT/DRINK FOR 20 MINUTES AFTER.  0  . busPIRone (BUSPAR) 7.5 MG tablet 1-2 tab po bid as needed 20 tablet 0  . clindamycin (CLEOCIN) 300 MG capsule Take 1 capsule (300 mg total) by mouth 4 (four) times daily. X 7 days 28 capsule 0  . cloNIDine (CATAPRES) 0.1 MG tablet Take 1 tablet (0.1 mg total) by mouth 3 (three) times daily. Must follow up in one month. 270 tablet 0  . gabapentin (NEURONTIN) 300 MG capsule 3 tab po tid (Patient taking differently: 800 mg 2 (two) times daily. ) 270 capsule 0  . methocarbamol (ROBAXIN) 500 MG tablet Take 1 tablet (500 mg total) by mouth every 8 (eight) hours as needed. 60 tablet 1  . naproxen (NAPROSYN) 500 MG tablet Take 1  tablet twice daily with food as needed for pain. 20 tablet 0  . NARCAN 4 MG/0.1ML LIQD nasal spray kit USE ONE TIME IF NEEDED (SPRAY INTO ONE NOSTRIL FOR EMERGENCY TREATMENT OF KNOWN OR SUSPECTED OPIATE OVERDOSE MAY REPEAT 2-3 MINUTES OPOSITE  5  . OXcarbazepine (TRILEPTAL) 150 MG tablet TAKE 1 TABLET BY MOUTH EVERY MORNING AND TAKE 2 TABLETS BY MOUTH AT BEDTIME 270 tablet 0  . QUEtiapine (SEROQUEL) 200 MG tablet TAKE 1 TABLET BY MOUTH EVERY DAY AT BEDTIME 30 tablet 2   No current facility-administered medications on file prior to visit.     BP 115/65   Pulse 81   Temp 98.4 F (36.9 C) (Temporal)   Resp 16   Ht 5' 11"  (1.803 m)   Wt 217 lb 6.4 oz (98.6 kg)   SpO2 97%   BMI 30.32 kg/m       Objective:   Physical Exam  General- No acute distress. Pleasant patient. Neck- Full range of motion, no jvd Lungs- Clear, even and unlabored. Heart- regular rate and rhythm. Neurologic- CNII- XII grossly intact.  Rt hand- overall good rom. No swelling and normal color. Rt index finger- moderate diffuse swelling. Normal color. Pt finger stuck in semi-flexed position. He can flex or extend finger fully.       Assessment & Plan:  For your rt index finger pain, I think you have skin infection vs possible gout. Will get xray of digit, cbc and uric acid.  We gave you toradol 60 mg im injection today. Please start bactrim DS antibiotic today.  If you have color changes to finger increased swelling of finger or swelling up arm then recommend ED evaluation.  Will follow up you lab and xray.update you.   If labs, xray negative but no changes to finger then may consider referral to specialist to evaluate flexor and extendor tendon.  Follow up Monday or as needed  25 minutes spent with pt. 50% of time spent counseling on plan going forward

## 2019-01-07 ENCOUNTER — Other Ambulatory Visit: Payer: Self-pay

## 2019-01-07 ENCOUNTER — Ambulatory Visit (HOSPITAL_BASED_OUTPATIENT_CLINIC_OR_DEPARTMENT_OTHER)
Admission: RE | Admit: 2019-01-07 | Discharge: 2019-01-07 | Disposition: A | Discharge status: Home / Self Care | Payer: BC Managed Care – PPO | Source: Ambulatory Visit | Attending: Medical | Admitting: Medical

## 2019-01-07 DIAGNOSIS — M79644 Pain in right finger(s): Secondary | ICD-10-CM

## 2019-01-08 ENCOUNTER — Telehealth: Payer: Self-pay | Admitting: Medical

## 2019-01-08 NOTE — Telephone Encounter (Signed)
Returned call to patient regarding xray report and information from his pcp. Pt voiced understanding and is requesting that his pcp give him a referral to [facility]. He said that it helped him a great deal before.

## 2019-01-08 NOTE — Telephone Encounter (Signed)
Patient is calling for lab results. Please advise CB- [phone]

## 2019-01-12 ENCOUNTER — Telehealth: Payer: Self-pay | Admitting: Medical

## 2019-01-12 DIAGNOSIS — F1920 Other psychoactive substance dependence, uncomplicated: Secondary | ICD-10-CM

## 2019-01-12 NOTE — Telephone Encounter (Signed)
Referral for substance abuse placed.

## 2019-01-12 NOTE — Telephone Encounter (Signed)
I am putting in a new referral. But notify pt since he saw them before he can also call and explain he wants to re-establish care. Referral in process.

## 2019-01-12 NOTE — Telephone Encounter (Signed)
Opened to review prior substance abuse referral.

## 2019-01-13 NOTE — Telephone Encounter (Signed)
notified pt referral has been place. Pt states he went to schedule an appointment before referral and was told he would be put on a waiting list.

## 2019-02-08 ENCOUNTER — Other Ambulatory Visit: Payer: Self-pay | Admitting: Medical

## 2019-02-08 MED ORDER — GABAPENTIN 300 MG PO CAPS: ORAL_CAPSULE | ORAL | 0 refills | Status: DC

## 2019-02-08 NOTE — Telephone Encounter (Signed)
Pt states medication needs to go to:   [id number] [place], [place] [phone] (Phone) [phone] (Fax)   Pt also states he will be out of medication today, please refill asap.

## 2019-02-08 NOTE — Telephone Encounter (Signed)
Refill sent and pt has been notified. 

## 2019-03-09 ENCOUNTER — Other Ambulatory Visit: Payer: Self-pay | Admitting: Medical

## 2019-03-09 DIAGNOSIS — F3170 Bipolar disorder, currently in remission, most recent episode unspecified: Secondary | ICD-10-CM

## 2019-03-09 MED ORDER — GABAPENTIN 300 MG PO CAPS: ORAL_CAPSULE | ORAL | 0 refills | Status: DC

## 2019-03-09 NOTE — Telephone Encounter (Signed)
Requested medication (s) are due for refill today:yes  Requested medication (s) are on the active medication list: yes  Last refill:  12/11/2018  Future visit scheduled: no  Notes to clinic:  med can not be delegated. Anticonvulsants - oxcarbazine failed  Requested Prescriptions  Pending Prescriptions Disp Refills   OXcarbazepine (TRILEPTAL) 150 MG tablet 270 tablet 0    Sig: TAKE 1 TABLET BY MOUTH EVERY MORNING AND TAKE 2 TABLETS BY MOUTH AT BEDTIME      Not Delegated - Neurology: Anticonvulsants - oxcarbazepine Failed - 03/09/2019  4:20 PM      Failed - This refill cannot be delegated      Passed - Na in normal range and within 360 days    Sodium  Date Value Ref Range Status  04/14/2018 137 135 - 145 mEq/L Final  11/25/2017 138 134 - 144 mmol/L Final          Passed - WBC in normal range and within 360 days    WBC  Date Value Ref Range Status  01/06/2019 6.8 4.0 - 10.5 K/uL Final          Passed - PLT in normal range and within 360 days    Platelets  Date Value Ref Range Status  01/06/2019 374.0 150.0 - 400.0 K/uL Final          Passed - HCT in normal range and within 360 days    HCT  Date Value Ref Range Status  01/06/2019 44.0 39.0 - 52.0 % Final          Passed - HGB in normal range and within 360 days    Hemoglobin  Date Value Ref Range Status  01/06/2019 14.7 13.0 - 17.0 g/dL Final          Passed - Valid encounter within last 12 months    Recent Outpatient Visits           2 months ago Pain of finger of right hand   Holiday representative at [facility], [place], PA-C   4 months ago Bipolar disorder in partial remission, most recent episode unspecified type (HCC)   Holiday representative at [facility], [place], PA-C   5 months ago Pain of right lower extremity   Holiday representative at [facility], [place], [place]   7 months ago Erroneous encounter - disregard   Ecologist at [facility], [place], PA-C   10 months ago Skin infection   Holiday representative at [employer] [facility], [place], [place]               Signed Prescriptions Disp Refills   gabapentin (NEURONTIN) 300 MG capsule 270 capsule 0    Sig: 3 tab po tid      Neurology: Anticonvulsants - gabapentin Passed - 03/09/2019  4:20 PM      Passed - Valid encounter within last 12 months    Recent Outpatient Visits           2 months ago Pain of finger of right hand   Holiday representative at [facility], [place], PA-C   4 months ago Bipolar disorder in partial remission, most recent episode unspecified type [facility])   Holiday representative at [employer], [place], PA-C   5 months ago Pain of right lower extremity   Holiday representative at [facility]  Saguier, Percell Miller, PA-C   7 months ago Erroneous encounter - Counselling psychologist at [place], [place]   10 months ago Skin infection   Archivist at [place], [place]

## 2019-03-09 NOTE — Telephone Encounter (Signed)
Medication Refill - Medication: OXcarbazepine (TRILEPTAL) 150 MG tablet gabapentin (NEURONTIN) 300 MG capsule  Has the patient contacted their pharmacy? Yes - states they can not transfer to [facility] where he currently is. (Agent: If no, request that the patient contact the pharmacy for the refill.) (Agent: If yes, when and what did the pharmacy advise?)  Preferred Pharmacy (with phone number or street name):  [id number] [place], [place] - [address] Phone:  [phone]  Fax:  [phone]       Agent: Please be advised that RX refills may take up to 3 business days. We ask that you follow-up with your pharmacy.

## 2019-03-10 MED ORDER — OXCARBAZEPINE 150 MG PO TABS: ORAL_TABLET | ORAL | 0 refills | Status: DC

## 2019-03-15 ENCOUNTER — Other Ambulatory Visit: Payer: Self-pay | Admitting: Medical

## 2019-03-15 DIAGNOSIS — F3170 Bipolar disorder, currently in remission, most recent episode unspecified: Secondary | ICD-10-CM

## 2019-03-31 ENCOUNTER — Telehealth: Payer: Self-pay | Admitting: Medical

## 2019-03-31 NOTE — Telephone Encounter (Signed)
Spoke to pharmacy they refilled a partial Rx for patient not sure why but think it had to do with the fact that the patient was trying to pay out of pocket at first then ran through insurance . They stated he has plenty of tablets remaining and will fill those for patient now. Tried to contact patient to inform no answer.

## 2019-03-31 NOTE — Telephone Encounter (Signed)
Needs urgent refill on gabapentin 300 TID sent to [facility] pharmacy [address] [phone] (phone)in [place] today if possible. He wil be out tomorrow. Please call if you are unable to fill. Was supposed to get a 90 ds but was only given a weeks worth.  He is asking for a 90 ds supply this time.

## 2019-04-21 ENCOUNTER — Other Ambulatory Visit: Payer: Self-pay | Admitting: Medical

## 2019-04-21 DIAGNOSIS — F3170 Bipolar disorder, currently in remission, most recent episode unspecified: Secondary | ICD-10-CM

## 2019-04-26 ENCOUNTER — Other Ambulatory Visit: Payer: Self-pay | Admitting: Medical

## 2019-04-29 ENCOUNTER — Telehealth: Payer: Self-pay | Admitting: *Deleted

## 2019-04-29 ENCOUNTER — Other Ambulatory Visit: Payer: Self-pay | Admitting: Medical

## 2019-04-29 NOTE — Telephone Encounter (Addendum)
Patient called again for refill for gabapentin.  Advised patient this will be last refill until he be seen again.  He must have an office visit before any refills on medications.  He is currently out of town.  Options are come for visit, virtual, or find doctor there where he is in [place].

## 2019-05-14 ENCOUNTER — Ambulatory Visit (INDEPENDENT_AMBULATORY_CARE_PROVIDER_SITE_OTHER): Payer: BC Managed Care – PPO | Admitting: Medical

## 2019-05-14 ENCOUNTER — Other Ambulatory Visit: Payer: Self-pay

## 2019-05-14 DIAGNOSIS — F3130 Bipolar disorder, current episode depressed, mild or moderate severity, unspecified: Secondary | ICD-10-CM

## 2019-05-14 DIAGNOSIS — M79604 Pain in right leg: Secondary | ICD-10-CM

## 2019-05-14 DIAGNOSIS — F3170 Bipolar disorder, currently in remission, most recent episode unspecified: Secondary | ICD-10-CM

## 2019-05-14 MED ORDER — QUETIAPINE FUMARATE 200 MG PO TABS: ORAL_TABLET | ORAL | 2 refills | Status: DC

## 2019-05-14 MED ORDER — OXCARBAZEPINE 150 MG PO TABS: ORAL_TABLET | ORAL | 3 refills | Status: DC

## 2019-05-14 MED ORDER — GABAPENTIN 300 MG PO CAPS: 900.0000 mg | ORAL_CAPSULE | Freq: Three times a day (TID) | ORAL | 2 refills | Status: DC

## 2019-05-14 NOTE — Progress Notes (Signed)
   Subjective:    Patient ID: Clayton Stafford, male    DOB: [birth year], 29 y.o.   MRN: [id number]  HPI  Virtual Visit via Video Note  I connected with Clayton Stafford on 05/14/19 at 11:20 AM EST by a video enabled telemedicine application and verified that I am speaking with the correct person using two identifiers.  Location: Patient: car Provider: home   I discussed the limitations of evaluation and management by telemedicine and the availability of in person appointments. The patient expressed understanding and agreed to proceed.  History of Present Illness: Pt on the way back from [place] to [place].  Pt states he is running out of gabapentin. Takes  nerve pain and for back pain.  He is also running out of seroquel and tripleptal as well.  He has plenty of trileptal.   Hx of bipolar. I have in past wanted him to establish care with psychiatrist but pt states he was never able to establish care with one despite trying.  Pt has hx of anxiety. He never took buspar. Based on his history did not rx benzo.  Pt did not check bp. He is on the road.  Pt in car with girlfriend and family. So felt like could not go into detail directly regarding if he has had any substance abuse or mood issues. But he generally described doing well and just needing refills.       Observations/Objective: General-no acute distress, pleasant, oriented. Lungs- on inspection lungs appear unlabored. Neck- no tracheal deviation or jvd on inspection. Neuro- gross motor function appears intact.  Assessment and Plan: Patient does have history of right lower extremity pain/neuropathic-like.  He reports pain control with gabapentin and will refill gabapentin today.  History of bipolar and indicates doing well with Trileptal and Seroquel.  Refill Seroquel and trileptal  today.  Patient is in [place] mostly but does come down to visit his parents.  I asked for him to follow-up in June or as  needed.  By early summer if he still most of the time in [place] going to ask him to establish care there as with his history of a needs to be seen in person having a local provider would be beneficial.  Follow Up Instructions:    I discussed the assessment and treatment plan with the patient. The patient was provided an opportunity to ask questions and all were answered. The patient agreed with the plan and demonstrated an understanding of the instructions.   The patient was advised to call back or seek an in-person evaluation if the symptoms worsen or if the condition fails to improve as anticipated.  I provided 25 minutes of non-face-to-face time during this encounter.   Esperanza Richters, PA-C   Review of Systems     Objective:   Physical Exam        Assessment & Plan:

## 2019-05-14 NOTE — Patient Instructions (Addendum)
Patient does have history of right lower extremity pain/neuropathic-like.  He reports pain control with gabapentin and will refill gabapentin today.  History of bipolar and indicates doing well with Trileptal and Seroquel.  Refill Seroque and trileptal l today.  Patient is in [place] mostly but does come down to visit his parents.  I asked for him to follow-up in June or as needed.  By early summer if he still most of the time in [place] going to ask him to establish care there as with his history of a needs to be seen in person having a local provider would be beneficial.

## 2019-05-28 ENCOUNTER — Telehealth: Payer: Self-pay | Admitting: Medical

## 2019-05-28 ENCOUNTER — Telehealth: Payer: Self-pay

## 2019-05-28 NOTE — Telephone Encounter (Signed)
Caller : Shaman Muscarella  Call Back # [phone]  Patient states that his LOST his prescription , while out of state. Patient need at least a 7 day supple sent to pharmacy in [place]. Patient has return to [place].     gabapentin (NEURONTIN) 300 MG capsule [[id number]]   [email or web address] [address], [place], [place] [address]  < 1 mi [phone]

## 2019-05-28 NOTE — Telephone Encounter (Signed)
Left in [place] and needs some until he returns to [employer] [facility]

## 2019-05-28 NOTE — Telephone Encounter (Signed)
Patient called in to see if PA Esperanza Richters could send in a prescription for  gabapentin (NEURONTIN) 300 MG capsule [[id number]]    Please send it to [id number] - [place], [place] - [address] AT [facility] [address]  [address], [place] [address]  Phone:  [phone] Fax:  [phone]  DEA #:  [id number]

## 2019-05-28 NOTE — Telephone Encounter (Signed)
Message does not sound clear. He lost rx but only needs 7 days. Gabapentin is medication that is considered controlled in 3 states and may in near future be classified as controlled medication across the nation. I don't feel comfortable refilling gabapentin for lost rx.   Does he have some in [place] but just did not bring it to [place]?

## 2019-05-30 NOTE — Telephone Encounter (Signed)
Will you  send in 6 days only of gabapentin to local pharmacy. I am not sure insurance will fill since he has active prescription. He needs to figure out how to get meds here locally or return to [place] to get medicine after 6 days.  If you can load the pharmacy for me with script for me sign or call in to [facility].   Should be gabapentin 300 mg #54 3 tab po tid.  I could no find pharmacy in [place]. Not sure why. See his preferred pharmacy.

## 2019-08-03 ENCOUNTER — Other Ambulatory Visit: Payer: Self-pay | Admitting: Medical

## 2019-08-06 ENCOUNTER — Telehealth: Payer: Self-pay

## 2019-08-06 ENCOUNTER — Telehealth: Payer: Self-pay | Admitting: Medical

## 2019-08-06 DIAGNOSIS — F1920 Other psychoactive substance dependence, uncomplicated: Secondary | ICD-10-CM

## 2019-08-06 NOTE — Telephone Encounter (Signed)
Referral placed.

## 2019-08-06 NOTE — Telephone Encounter (Signed)
Patient called in to see if PA Esperanza Richters can put in a referral for [facility] center. Please follow up with the patient to advise at [phone]

## 2019-08-06 NOTE — Telephone Encounter (Signed)
Patient needs referral to suboxone pain clinic at [facility] .

## 2019-08-06 NOTE — Telephone Encounter (Signed)
Referral to [facility] for substance abuse.

## 2019-08-10 ENCOUNTER — Telehealth: Payer: Self-pay | Admitting: Medical

## 2019-08-10 NOTE — Telephone Encounter (Signed)
CallerMartell Mcfadyen  Call Back # [phone]  Patient states he would like to go to [facility] on [address]. Please sen this office the referral per patient.

## 2019-08-11 NOTE — Telephone Encounter (Signed)
Referral faxed via [facility] to [facility]

## 2019-08-11 NOTE — Telephone Encounter (Signed)
LM advising patient of referral, mychart message also sent.

## 2019-08-25 ENCOUNTER — Telehealth: Payer: Self-pay

## 2019-08-25 NOTE — Telephone Encounter (Signed)
Patient called in to see if he could get a referral to a Hand specialist.Please give the patient a call back as soon as possible at [phone]

## 2019-08-26 NOTE — Telephone Encounter (Signed)
Appointment scheduled for tomorrow.

## 2019-08-26 NOTE — Telephone Encounter (Signed)
Schedule in office appointment to discuss. Document need for referral. Then can make the referral. I don't why need to refer so need appoitnment

## 2019-08-27 ENCOUNTER — Other Ambulatory Visit: Payer: Self-pay

## 2019-08-27 ENCOUNTER — Ambulatory Visit (INDEPENDENT_AMBULATORY_CARE_PROVIDER_SITE_OTHER): Payer: BC Managed Care – PPO | Admitting: Medical

## 2019-08-27 VITALS — BP 115/65 | HR 74 | Temp 96.7°F | Resp 16 | Ht 71.0 in | Wt 214.0 lb

## 2019-08-27 DIAGNOSIS — G5602 Carpal tunnel syndrome, left upper limb: Secondary | ICD-10-CM | POA: Diagnosis not present

## 2019-08-27 DIAGNOSIS — M65332 Trigger finger, left middle finger: Secondary | ICD-10-CM | POA: Diagnosis not present

## 2019-08-27 NOTE — Patient Instructions (Addendum)
For hx of possible trigger finger refer to hand specialist as pt requested.  You do have possible early symptoms of carpel tunnel syndrome. Can use wrist cock up splint and low dose alleve over the counter twice daily. Put this on referral as well.  For depression and anxiety stay on current meds. Gave list of psychiatrist to call. Let me know who you chose/called. When you find who will see let me know and I can call that office ask to expidite appointment if needed  Follow up 2 weeks or as needed

## 2019-08-27 NOTE — Progress Notes (Signed)
Subjective:    Patient ID: Clayton Stafford, male    DOB: 1990/12/18, 29 y.o.   MRN: [id number]  HPI  Pt in with left 3rd digit that will get stuck occasionally in flexed position. Some time he has to move it back and forth to get it out flex position. He states on and off for about 1.5 years. Maybe started 2 years ago.  Pt states 2 times recently some numbness to his left hand up to his foream. No loss of movement but had to wait little bit for numbness and tingling to resolve the other night.     Review of Systems  Constitutional: Negative for chills, fatigue and fever.  Respiratory: Negative for cough, chest tightness, shortness of breath, wheezing and stridor.   Cardiovascular: Negative for chest pain and palpitations.  Gastrointestinal: Negative for abdominal pain.  Neurological: Negative for dizziness and headaches.  Hematological: Negative for adenopathy. Does not bruise/bleed easily.  Psychiatric/Behavioral: Positive for dysphoric mood. Negative for sleep disturbance and suicidal ideas. The patient is nervous/anxious.        Hx of and he wants number to psychiatrist. Have given sheet various times in the past. Overall stable presently. He expresses will go now.    Past Medical History:  Diagnosis Date  . ADHD   . Anxiety   . Bipolar 1 disorder ([place])   . Depression   . Substance abuse ([facility])      Social History   Socioeconomic History  . Marital status: Single    Spouse name: Not on file  . Number of children: Not on file  . Years of education: Not on file  . Highest education level: Not on file  Occupational History  . Occupation: Environmental consultant at [employer]  . Smoking status: Current Every Day Smoker    Packs/day: 2.00    Types: Cigarettes  . Smokeless tobacco: Never Used  Vaping Use  . Vaping Use: Never used  Substance and Sexual Activity  . Alcohol use: Yes    Alcohol/week: 2.0 standard drinks    Types: 2 Cans of beer per week    Comment:  weekly  . Drug use: Yes    Types: IV, "Crack" cocaine, Cocaine, Heroin, Methamphetamines, Marijuana, LSD    Comment: patient in recovery at this time  . Sexual activity: Not on file  Other Topics Concern  . Not on file  Social History Narrative  . Not on file   Social Determinants of Health   Financial Resource Strain:   . Difficulty of Paying Living Expenses:   Food Insecurity:   . Worried About Charity fundraiser in the Last Year:   . Arboriculturist in the Last Year:   Transportation Needs:   . Film/video editor (Medical):   Marland Kitchen Lack of Transportation (Non-Medical):   Physical Activity:   . Days of Exercise per Week:   . Minutes of Exercise per Session:   Stress:   . Feeling of Stress :   Social Connections:   . Frequency of Communication with Friends and Family:   . Frequency of Social Gatherings with Friends and Family:   . Attends Religious Services:   . Active Member of Clubs or Organizations:   . Attends Archivist Meetings:   Marland Kitchen Marital Status:   Intimate Partner Violence:   . Fear of Current or Ex-Partner:   . Emotionally Abused:   Marland Kitchen Physically Abused:   . Sexually Abused:  No past surgical history on file.  Family History  Problem Relation Age of Onset  . Depression Father   . Heart attack Father   . Hypertension Mother   . Bladder Cancer Paternal Grandfather     No Known Allergies  Current Outpatient Medications on File Prior to Visit  Medication Sig Dispense Refill  . gabapentin (NEURONTIN) 300 MG capsule TAKE 3 CAPSULES BY MOUTH 3 TIMES A DAY 270 capsule 1  . NARCAN 4 MG/0.1ML LIQD nasal spray kit USE ONE TIME IF NEEDED (SPRAY INTO ONE NOSTRIL FOR EMERGENCY TREATMENT OF KNOWN OR SUSPECTED OPIATE OVERDOSE MAY REPEAT 2-3 MINUTES OPOSITE  5  . OXcarbazepine (TRILEPTAL) 150 MG tablet 1 tab po q am and 2 tab at night 90 tablet 3  . QUEtiapine (SEROQUEL) 200 MG tablet TAKE 1 TABLET BY MOUTH EVERYDAY AT BEDTIME 90 tablet 2  .  buprenorphine-naloxone (SUBOXONE) 8-2 mg SUBL SL tablet PLACE HALF A TAB UNDER TONGUE TWICE DAILY AND DISSOLVE. DO NOT EAT/DRINK FOR 20 MINUTES AFTER. (Patient not taking: Reported on 08/27/2019)  0  . busPIRone (BUSPAR) 7.5 MG tablet 1-2 tab po bid as needed (Patient not taking: Reported on 08/27/2019) 20 tablet 0  . clindamycin (CLEOCIN) 300 MG capsule Take 1 capsule (300 mg total) by mouth 4 (four) times daily. X 7 days (Patient not taking: Reported on 08/27/2019) 28 capsule 0  . cloNIDine (CATAPRES) 0.1 MG tablet Take 1 tablet (0.1 mg total) by mouth 3 (three) times daily. Must follow up in one month. (Patient not taking: Reported on 08/27/2019) 270 tablet 0  . methocarbamol (ROBAXIN) 500 MG tablet Take 1 tablet (500 mg total) by mouth every 8 (eight) hours as needed. (Patient not taking: Reported on 08/27/2019) 60 tablet 1  . naproxen (NAPROSYN) 500 MG tablet Take 1 tablet twice daily with food as needed for pain. (Patient not taking: Reported on 08/27/2019) 20 tablet 0  . sulfamethoxazole-trimethoprim (BACTRIM DS) 800-160 MG tablet Take 1 tablet by mouth 2 (two) times daily. (Patient not taking: Reported on 08/27/2019) 20 tablet 0   No current facility-administered medications on file prior to visit.    BP 115/65 (BP Location: Left Arm, Patient Position: Sitting, Cuff Size: Large)   Pulse 74   Temp (!) 96.7 F (35.9 C) (Temporal)   Resp 16   Ht 5' 11" (1.803 m)   Wt 214 lb (97.1 kg)   SpO2 96%   BMI 29.85 kg/m       Objective:   Physical Exam  General Mental Status- Alert. General Appearance- Not in acute distress.   Skin General: Color- Normal Color. Moisture- Normal Moisture.  Neck Carotid Arteries- Normal color. Moisture- Normal Moisture. No carotid bruits. No JVD.  Chest and Lung Exam Auscultation: Breath Sounds:-Normal.  Cardiovascular Auscultation:Rythm- Regular. Murmurs & Other Heart Sounds:Auscultation of the heart reveals- No  Murmurs.  Abdomen Inspection:-Inspeection Normal. Palpation/Percussion:Note:No mass. Palpation and Percussion of the abdomen reveal- Non Tender, Non Distended + BS, no rebound or guarding.    Neurologic Cranial Nerve exam:- CN III-XII intact(No nystagmus), symmetric smile. Strength:- 5/5 equal and symmetric strength both upper and lower extremities.  Left hand- on inspection normal. On exam when 3rd digit flexs and extends no obvious trigger finger.  Left upper ext- negative phalens signs.    Assessment & Plan:  For hx of possible trigger finger refer to hand specialist as pt requested.  You do have possible early symptoms of carpel tunnel syndrome. Can use wrist cock up splint and  low dose alleve over the counter twice daily. Put this on referral as well.  For depression and anxiety stay on current meds. Gave list of psychiatrist to call. Let me know who you chose/called.  Follow up 2 weeks or as needed   Time spent with patient today was  20 minutes which consisted of chart review, discussing diagnosis, work up, treatment and documentation. Mackie Pai, PA-C

## 2019-08-30 ENCOUNTER — Telehealth: Payer: Self-pay

## 2019-08-30 NOTE — Telephone Encounter (Signed)
Patient wants to know if there he can do for the gabapentin instead of him taking 9 pills a day.  Like take less pills or lower dosage or increase dosage .

## 2019-08-30 NOTE — Telephone Encounter (Signed)
Patient called in to speak with the nurse about his medication. Please call the patient back at [phone]

## 2019-09-01 ENCOUNTER — Telehealth: Payer: Self-pay | Admitting: Medical

## 2019-09-01 MED ORDER — GABAPENTIN 800 MG PO TABS
800.0000 mg | ORAL_TABLET | Freq: Three times a day (TID) | ORAL | 0 refills | Status: DC
Start: 2019-09-01 — End: 2019-10-25

## 2019-09-01 NOTE — Telephone Encounter (Signed)
Rx gabapentin refill sent to patient pharmacy.

## 2019-09-01 NOTE — Telephone Encounter (Signed)
Spoke with [facility] they stated the 300 mg was already cancelled due to the note on the new script.

## 2019-09-01 NOTE — Telephone Encounter (Signed)
I can prescribe 800 mg tid. But we need to make sure the 300 mg refill is cancelled will you call and cancel that refill.

## 2019-09-03 NOTE — Telephone Encounter (Signed)
Opened to review and sent in new rx gabapentin higher dose.

## 2019-09-03 NOTE — Telephone Encounter (Signed)
Reviewed chart 

## 2019-09-09 ENCOUNTER — Telehealth: Payer: Self-pay | Admitting: Medical

## 2019-09-09 NOTE — Telephone Encounter (Signed)
Caller Amdrew Oboyle Call Back # [phone]   Patient states doseage of gabapentin (NEURONTIN) 800 MG tablet changed from 900MG  to 800MG .  Patient states that he has trigger finger and needs medication to be change back to normal dosage.   Please Advise

## 2019-09-10 NOTE — Telephone Encounter (Signed)
On message sent to me he was asking for dosing that was more convenient as opposed to taking 3 tab(300 mg) tid. So I prescribed 800 mg tabs. Gabapentin is not controlled med but I understand some states treat as controlled and fda considering making it controlled. So he needs to stay on current dose. Then when runs out of current can but him back on 900 mg tid. Although I usually write 800 mg tid as max dose.    Regarding finger issue I put in referral to specialist.

## 2019-09-10 NOTE — Telephone Encounter (Signed)
Scheduled pt an appointment for Monday , wants to talk in person

## 2019-09-13 ENCOUNTER — Ambulatory Visit: Payer: BC Managed Care – PPO | Admitting: Medical

## 2019-09-13 DIAGNOSIS — Z0289 Encounter for other administrative examinations: Secondary | ICD-10-CM

## 2019-09-15 ENCOUNTER — Ambulatory Visit (INDEPENDENT_AMBULATORY_CARE_PROVIDER_SITE_OTHER): Payer: BC Managed Care – PPO | Admitting: Medical

## 2019-09-15 ENCOUNTER — Other Ambulatory Visit: Payer: Self-pay

## 2019-09-15 VITALS — BP 118/60 | HR 70 | Resp 18 | Ht 71.0 in | Wt 223.0 lb

## 2019-09-15 DIAGNOSIS — M79604 Pain in right leg: Secondary | ICD-10-CM | POA: Diagnosis not present

## 2019-09-15 DIAGNOSIS — M79642 Pain in left hand: Secondary | ICD-10-CM | POA: Diagnosis not present

## 2019-09-15 LAB — SEDIMENTATION RATE: Sed Rate: 8 mm/hr (ref 0–15)

## 2019-09-15 LAB — C-REACTIVE PROTEIN: CRP: <1 mg/dL (ref 0.5–20.0)

## 2019-09-15 MED ORDER — GABAPENTIN 100 MG PO CAPS
100.0000 mg | ORAL_CAPSULE | Freq: Three times a day (TID) | ORAL | 3 refills | Status: AC
Start: 2019-09-15 — End: ?

## 2019-09-15 NOTE — Progress Notes (Signed)
   Subjective:    Patient ID: Clayton Stafford, male    DOB: 1991/02/03, 29 y.o.   MRN: [id number]  HPI  Pt in for follow up.  I got message week that I was on vacation.  I understood that he wanted more convenient dosing of his gabapentin. Pt states that was misunderstanding. He wants to be back on 900 mg tid. I had written him 800 mg tid. Rx IS  for history of rt leg neuropathy pain after trauma.  Pt has upcoming appointment tomorrow for hand pain and inabillity to full flex or extend hand.  On prior visit age as described third digit getting stuck sometimes on flexion.  At that time it appears more trigger finger like.  Now he stating difficulty fully flexing or extending third fourth and fifth digit.  Review of Systems   See HPI.    Objective:   Physical Exam  General- No acute distress. Pleasant patient. Neck- Full range of motion, no jvd Lungs- Clear, even and unlabored. Heart- regular rate and rhythm. Neurologic- CNII- XII grossly intact.  Left hand-on palpation hand does not feel warm, indurated or tender.  However does not look slightly swollen compared to right hand.  He demonstrates that he cannot fully flex or extend digits.      Assessment & Plan:  For history of rt lower ext pain/neuropathic like I gave rx of additional 100 mg gabapentin. This will get you back to your former dose before recent change.  For left hand pain see hand specialist. Today after exam decided to add inflammatory lab studies. You can let specialist know these are pending.  Follow up 3-4 weeks or as needed  Esperanza Richters, PA-C   Time spent with patient today was  20 minutes which consisted of chart review, discussing diagnosis, work up, treatment and documentation.

## 2019-09-15 NOTE — Patient Instructions (Signed)
For history of rt lower ext pain/neuropathic like I gave rx of additional 100 mg gabapentin. This will get you back to your former dose before recent change.  For left hand pain see hand specialist. Today after exam decided to add inflammatory lab studies. You can let specialist know these are pending.  Follow up 3-4 weeks or as needed

## 2019-09-21 ENCOUNTER — Telehealth: Payer: Self-pay | Admitting: Medical

## 2019-09-21 DIAGNOSIS — R768 Other specified abnormal immunological findings in serum: Secondary | ICD-10-CM

## 2019-09-21 DIAGNOSIS — M79642 Pain in left hand: Secondary | ICD-10-CM

## 2019-09-21 DIAGNOSIS — M0580 Other rheumatoid arthritis with rheumatoid factor of unspecified site: Secondary | ICD-10-CM

## 2019-09-21 LAB — RHEUMATOID FACTOR: Rheumatoid fact SerPl-aCnc: 42 IU/mL — ABNORMAL HIGH (ref <14)

## 2019-09-21 LAB — ANTI-NUCLEAR AB-TITER (ANA TITER): ANA Titer 1: 1:40 {titer} — ABNORMAL HIGH

## 2019-09-21 LAB — ANA: Anti Nuclear Antibody (ANA): POSITIVE — AB

## 2019-09-21 NOTE — Telephone Encounter (Signed)
  Referral to rheumatologist placed. 

## 2019-10-14 ENCOUNTER — Telehealth: Payer: Self-pay | Admitting: Medical

## 2019-10-14 NOTE — Telephone Encounter (Signed)
Patient states medication instructions are written wrong on: OXcarbazepine (TRILEPTAL) 150 MG tablet [[id number]]  Please Advise,  Patient is requesting a refill and pharmacy states to soon due to instructions on pill bottle.

## 2019-10-15 ENCOUNTER — Telehealth: Payer: Self-pay | Admitting: Medical

## 2019-10-15 NOTE — Telephone Encounter (Signed)
Opened to review 

## 2019-10-15 NOTE — Telephone Encounter (Signed)
What are his instructions. On review of prior rx 150 mg in am and 2 in pm. I have been refilling his meds as he has never established with psychiatrist/not sure why he never got established?. Did pharmacy put different instruction on last bottle? Ask pt what came of his last attempt to establish with psychiatrist?   Still recommend him establishing care with psychiatrist.   I don't want to make changes to his historical dosage.  How is his mood?

## 2019-10-15 NOTE — Telephone Encounter (Signed)
Spoke with pharmacy on file where the medication was last sent and they said he hasn't filled the script since March and it is on hold at another pharmacy, and patient hops around to different pharmacies.

## 2019-10-16 NOTE — Telephone Encounter (Signed)
If you would call pt and ask pt about why active scripts at other pharmacies? Also how many tabs was he taking if differed from how I wrote. Is he getting med from other provider?   Not sure what is going on.  Maybe he needs appointment to discuss.

## 2019-10-18 NOTE — Telephone Encounter (Signed)
Patient states he has been taking the medication 300 mg at morning and 300 mg in the evening but pharmacy is stating its 2 in the morning and 1 in the evening & [facility] & pharmacy states he can fill the script next month and he isnt receiving the medication from any other provider.

## 2019-10-19 ENCOUNTER — Telehealth: Payer: Self-pay | Admitting: Medical

## 2019-10-19 MED ORDER — OXCARBAZEPINE 300 MG PO TABS
300.0000 mg | ORAL_TABLET | Freq: Two times a day (BID) | ORAL | 0 refills | Status: DC
Start: 2019-10-19 — End: 2019-12-02

## 2019-10-19 NOTE — Telephone Encounter (Signed)
Rx trileptal sent to pt pharmacy. Have pt follow up in one month.   Explain to pt not to increase dose.   On review his prior dose was 150 mg in in am and 2 in pm. This is only way I had filled in past. Did increase since appears he increased dose on med review and requesting higher dose. Confirm he is doing well mood wise.  Explain not to change dosing going forward without discussion/instruction. If any side effects or mood worsening let us know.  Only gave one month of med

## 2019-10-19 NOTE — Telephone Encounter (Signed)
Rx sent see note I sent you.

## 2019-10-20 ENCOUNTER — Telehealth: Payer: Self-pay | Admitting: Medical

## 2019-10-20 NOTE — Telephone Encounter (Signed)
Called pt and lvm to return call.  

## 2019-10-20 NOTE — Telephone Encounter (Signed)
Appointment set for next month

## 2019-10-20 NOTE — Telephone Encounter (Signed)
Caller Clayton Stafford   Call Back # [phone]  Patient states he would like a call back from CMA.

## 2019-10-24 ENCOUNTER — Other Ambulatory Visit: Payer: Self-pay | Admitting: Medical

## 2019-10-25 NOTE — Telephone Encounter (Signed)
Patient states he needs medication ASAP    Medication: gabapentin (NEURONTIN) 100 MG capsule [[id number]]    Has the patient contacted their pharmacy? No. (If no, request that the patient contact the pharmacy for the refill.) (If yes, when and what did the pharmacy advise?)  Preferred Pharmacy (with phone number or street name):  [id number] [place] [place] - [address]  [address] [place] [address]  Phone:  [phone] Fax:  [phone]  DEA #:  [id number] Agent: Please be advised that RX refills may take up to 3 business days. We ask that you follow-up with your pharmacy.

## 2019-10-26 ENCOUNTER — Other Ambulatory Visit: Payer: Self-pay | Admitting: Medical

## 2019-10-26 DIAGNOSIS — F3170 Bipolar disorder, currently in remission, most recent episode unspecified: Secondary | ICD-10-CM

## 2019-10-28 ENCOUNTER — Telehealth: Payer: Self-pay | Admitting: Medical

## 2019-10-28 NOTE — Telephone Encounter (Signed)
I got refill request for trileptal. I sent new rx in higher dose on 10-19-2019.Not sure why getting new request. Can you call pharmacy see what deal is?

## 2019-10-28 NOTE — Telephone Encounter (Signed)
Opened to review 

## 2019-11-02 NOTE — Telephone Encounter (Signed)
Auto refill

## 2019-11-09 ENCOUNTER — Ambulatory Visit: Payer: BC Managed Care – PPO | Admitting: Medical

## 2019-11-09 DIAGNOSIS — Z0289 Encounter for other administrative examinations: Secondary | ICD-10-CM

## 2019-11-11 ENCOUNTER — Other Ambulatory Visit: Payer: Self-pay | Admitting: Medical

## 2019-11-11 NOTE — Telephone Encounter (Signed)
Pt is asking for refill of trileptal. I remember recently filling. I don't think he is due. Was he scheduled recently and then missed appointment? Ask him to follow up next week.  Call pharmacy and confirm that he is not due for refill.   Let me know what they say.

## 2019-11-23 ENCOUNTER — Other Ambulatory Visit: Payer: Self-pay | Admitting: Medical

## 2019-11-29 ENCOUNTER — Other Ambulatory Visit: Payer: Self-pay | Admitting: Medical

## 2019-11-29 DIAGNOSIS — F3170 Bipolar disorder, currently in remission, most recent episode unspecified: Secondary | ICD-10-CM

## 2019-12-01 ENCOUNTER — Telehealth: Payer: Self-pay | Admitting: Medical

## 2019-12-01 NOTE — Telephone Encounter (Signed)
Medication: Oxcarbazepine (TRILEPTAL) 300 MG tablet [[id number]]       Has the patient contacted their pharmacy?  (If no, request that the patient contact the pharmacy for the refill.) (If yes, when and what did the pharmacy advise?)     Preferred Pharmacy (with phone number or street name): [id number] [place], [place] - [address]  [address] [place] [address]  Phone:  [phone] Fax:  [phone]     Agent: Please be advised that RX refills may take up to 3 business days. We ask that you follow-up with your pharmacy.

## 2019-12-02 ENCOUNTER — Telehealth: Payer: Self-pay | Admitting: Medical

## 2019-12-02 MED ORDER — OXCARBAZEPINE 300 MG PO TABS: 300.0000 mg | ORAL_TABLET | Freq: Two times a day (BID) | ORAL | 0 refills | Status: DC

## 2019-12-02 NOTE — Telephone Encounter (Signed)
Refilled trileptal for 2 weeks.

## 2019-12-02 NOTE — Telephone Encounter (Signed)
Please advise- looks like he missed visit this month. I was unsure if this is a controlled substance however.

## 2019-12-02 NOTE — Telephone Encounter (Signed)
I sent 2 week refill. He needs to get scheduled for follow up before he runs out. I had remembered on last refill asking him to get scheduled. So please explain needs office visit.

## 2019-12-02 NOTE — Telephone Encounter (Signed)
Patient states he completely out of medication and is requesting someone to send into the pharmacy.

## 2019-12-03 NOTE — Telephone Encounter (Signed)
LMOM informing Pt that [facility] sent a 2 week supply, however he will need to be seen in the office within the 2 weeks for more refills. Instructed to call office to schedule appt.

## 2019-12-12 ENCOUNTER — Other Ambulatory Visit: Payer: Self-pay | Admitting: Medical

## 2019-12-13 ENCOUNTER — Telehealth: Payer: Self-pay | Admitting: Medical

## 2019-12-13 NOTE — Telephone Encounter (Signed)
Pt requesting refill . Last filled on 12/02/19. Please advise on refill

## 2019-12-13 NOTE — Telephone Encounter (Signed)
Selena Batten would you help me with the belo.  About 2 weeks ago I had given refill of trileptal and advised pt to follow up in 2 weeks. He has not followed up. Also he has 4 no shows in the past.   I have for a year of more asked pt to scheduled appointment with psychiatrist as preferred specialist take over prescribing his psychiatric meds.  He never got scheduled with psychiatry. Not sure why so I have been the prescriber by default.  I saw on controlled site he got prescription from a Dr. Eulogio Ditch late September.   That Dr.is psychiatrist per website review.   So he should get them to handle all psychiatric meds. That would be ideal know that he established.  Let me know what he says.   Don't tell him below but provide some context  I have been thinking of dismissing him due to non compliance on my repeated request for him to see pyschiatrist Some recent no shows and non compliance as well. Not sure why he would see psychiatrist and not tell me. Looks like psychiatrist prescribing med for substance abuse but they can write other meds as well??

## 2019-12-14 ENCOUNTER — Telehealth: Payer: Self-pay | Admitting: Medical

## 2019-12-14 NOTE — Telephone Encounter (Signed)
Clayton Stafford will you help me with the below,    About 2 weeks ago I had given refill of trileptal and advised pt to follow up in 2 weeks. He has not followed up. Also he has 4 no shows in the past.   I have for a year of more asked pt to get  scheduled appointment with psychiatrist as preferred specialist take over prescribing his psychiatric meds.  He never got scheduled with psychiatry. Not sure why ?So I have been the prescriber by default.  I saw on controlled site he got prescription from a Dr. Eulogio Ditch late September.   That Dr.is psychiatrist per website review.   So he should get them to handle all psychiatric meds. That would be ideal know that he established.  Let me know what he says. I sent this to Kim yesterday I don't think she has called pt.   Don't tell him below but provide some context  I have been thinking of dismissing him due to non compliance on my repeated request for him to see pyschiatrist Some recent no shows and non compliance as well. Not sure why he would see psychiatrist and not tell me. Looks like psychiatrist prescribing med for substance abuse but they can write other meds as well??

## 2019-12-14 NOTE — Telephone Encounter (Signed)
Opened to review 

## 2019-12-15 ENCOUNTER — Telehealth: Payer: Self-pay | Admitting: Medical

## 2019-12-15 MED ORDER — OXCARBAZEPINE 300 MG PO TABS: 300.0000 mg | ORAL_TABLET | Freq: Two times a day (BID) | ORAL | 0 refills | Status: DC

## 2019-12-15 NOTE — Telephone Encounter (Signed)
Clayton Stafford called patient personally.

## 2019-12-15 NOTE — Telephone Encounter (Signed)
Appt made for 10/22

## 2019-12-15 NOTE — Telephone Encounter (Signed)
I did call patient today and found out that the clinic he is going to in [place] only prescribe Suboxone.  Although the doctor is actually psychiatrist.  However patient states that MD specifically states he only fills Suboxone and does not do psychiatric component.  I explained to patient he missed last appointment and that he needs to follow-up with me within 2 weeks.  He states he can only be seen on Fridays so advised to make appointment sometime on Friday before 29 October.  See last telephone note that I sent to staff.  Waont presently dismiss patient.  But he has no showed various times and if he does not begin then will seriously need to consider dismissal.  No-show today nontherapeutic relationship and with his psychiatry do not feel comfortable continuing to send in meds without office visit.

## 2019-12-20 ENCOUNTER — Other Ambulatory Visit: Payer: Self-pay | Admitting: Medical

## 2019-12-20 NOTE — Telephone Encounter (Signed)
Attempted to contact patient, no answer/VM.  

## 2019-12-22 NOTE — Telephone Encounter (Signed)
LM requesting call back.  

## 2019-12-24 ENCOUNTER — Ambulatory Visit: Payer: BC Managed Care – PPO | Admitting: Medical

## 2020-01-12 ENCOUNTER — Other Ambulatory Visit: Payer: Self-pay | Admitting: Medical

## 2020-01-13 ENCOUNTER — Telehealth: Payer: Self-pay | Admitting: Medical

## 2020-01-13 NOTE — Telephone Encounter (Signed)
Rx Oxcarbazepine sent to pt pharmacy.

## 2020-01-13 NOTE — Telephone Encounter (Signed)
Pt has missed various appointment in past and recently. I think need to dismiss based on non therapeutic relationship. Can explain details but wanted to discuss with you first.

## 2020-01-17 ENCOUNTER — Encounter: Payer: Self-pay | Admitting: Medical

## 2020-01-17 NOTE — Telephone Encounter (Signed)
Letter printed and dismissal entered.

## 2020-01-24 ENCOUNTER — Other Ambulatory Visit: Payer: Self-pay | Admitting: Medical

## 2020-01-24 NOTE — Telephone Encounter (Signed)
Discharged 11/15 .  Last refill was 10/18 ... is it ok to refill meds?

## 2020-01-24 NOTE — Telephone Encounter (Signed)
Patient called in reference to medications refills, patient  States he his out of medication and needs Gabapentin sent in asap.

## 2020-01-25 ENCOUNTER — Telehealth: Payer: Self-pay | Admitting: Medical

## 2020-01-25 MED ORDER — GABAPENTIN 800 MG PO TABS: 800.0000 mg | ORAL_TABLET | Freq: Three times a day (TID) | ORAL | 1 refills | Status: AC

## 2020-01-25 NOTE — Telephone Encounter (Signed)
Opened to refill gabapentin.

## 2020-01-31 ENCOUNTER — Telehealth: Payer: Self-pay | Admitting: Medical

## 2020-01-31 ENCOUNTER — Telehealth: Payer: Self-pay

## 2020-01-31 DIAGNOSIS — F3170 Bipolar disorder, currently in remission, most recent episode unspecified: Secondary | ICD-10-CM

## 2020-01-31 MED ORDER — QUETIAPINE FUMARATE 200 MG PO TABS: 200.0000 mg | ORAL_TABLET | Freq: Every day | ORAL | 0 refills | Status: AC

## 2020-01-31 NOTE — Telephone Encounter (Signed)
Marcelino Duster notified and she expressed gratitude.
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
## 2020-01-31 NOTE — Telephone Encounter (Signed)
I sent rx seroquel to pt pharmacy. Please notify mom rx sent.

## 2020-01-31 NOTE — Telephone Encounter (Signed)
Such an impossible situation but there is clearly loss of therapeutic relationship here and the dismissal is appropriate.

## 2020-01-31 NOTE — Telephone Encounter (Signed)
Returned call to patient's mother, Clayton Stafford. I explained that on multiple occasions Clayton Stafford had given Clayton Stafford direction on his next steps and each time Clayton Stafford did not follow through. Clayton Stafford had a long conversation with the patient in October explaining the need for him to follow up and even though Clayton Stafford scheduled an appointment, he did not keep it. I explain to the mother that Clayton Stafford had conversation with Dr.Blyth around the situation and though it is unfortunate, the dismissal will still stand.    Patient's mother regrets not attending appointments with him and states that in the future she will do so to be an advocate for him. I provided her with the phone number for [facility]. I explained to her the services that they can offer - both inpatient and outpatient- and that they can help manage his medication. Mother stated an understanding and feels like maybe this is the best move for Clayton Stafford it was very appreciative of the phone call

## 2020-01-31 NOTE — Telephone Encounter (Signed)
I called [facility] to see if medication was picked up from 11/23. It was not (does not seem pt was notified it was sent in) They still have it ready to be picked up. Mom asks that the Seroquel also be called in for a 30 day supply as well while they are getting him seen at the [facility] One Seroquel is calle din please advise mom so that they can pick it up.

## 2020-01-31 NOTE — Telephone Encounter (Signed)
seroquel one month rx sent to pt pharmacy.
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
## 2020-01-31 NOTE — Telephone Encounter (Signed)
Opened to review as have been discussing with Dr. Abner Greenspan.

## 2020-02-05 ENCOUNTER — Other Ambulatory Visit: Payer: Self-pay | Admitting: Medical

## 2020-02-17 ENCOUNTER — Other Ambulatory Visit: Payer: Self-pay | Admitting: Medical

## 2020-03-07 ENCOUNTER — Other Ambulatory Visit: Payer: Self-pay | Admitting: Medical

## 2020-03-07 DIAGNOSIS — F3170 Bipolar disorder, currently in remission, most recent episode unspecified: Secondary | ICD-10-CM

## 2020-03-17 ENCOUNTER — Other Ambulatory Visit: Payer: Self-pay | Admitting: Medical

## 2020-03-22 ENCOUNTER — Other Ambulatory Visit: Payer: Self-pay | Admitting: Medical

## 2020-03-22 IMAGING — DX DG FOOT COMPLETE 3+V*R*
3 series · 3 of 3 positions shown · non-contrast
Comparison: 11/07/2017 ankle films

CLINICAL DATA: Medial foot pain for 2 months, no known injury,
initial encounter

EXAM:
RIGHT FOOT COMPLETE - 3+ VIEW

[foot ap]
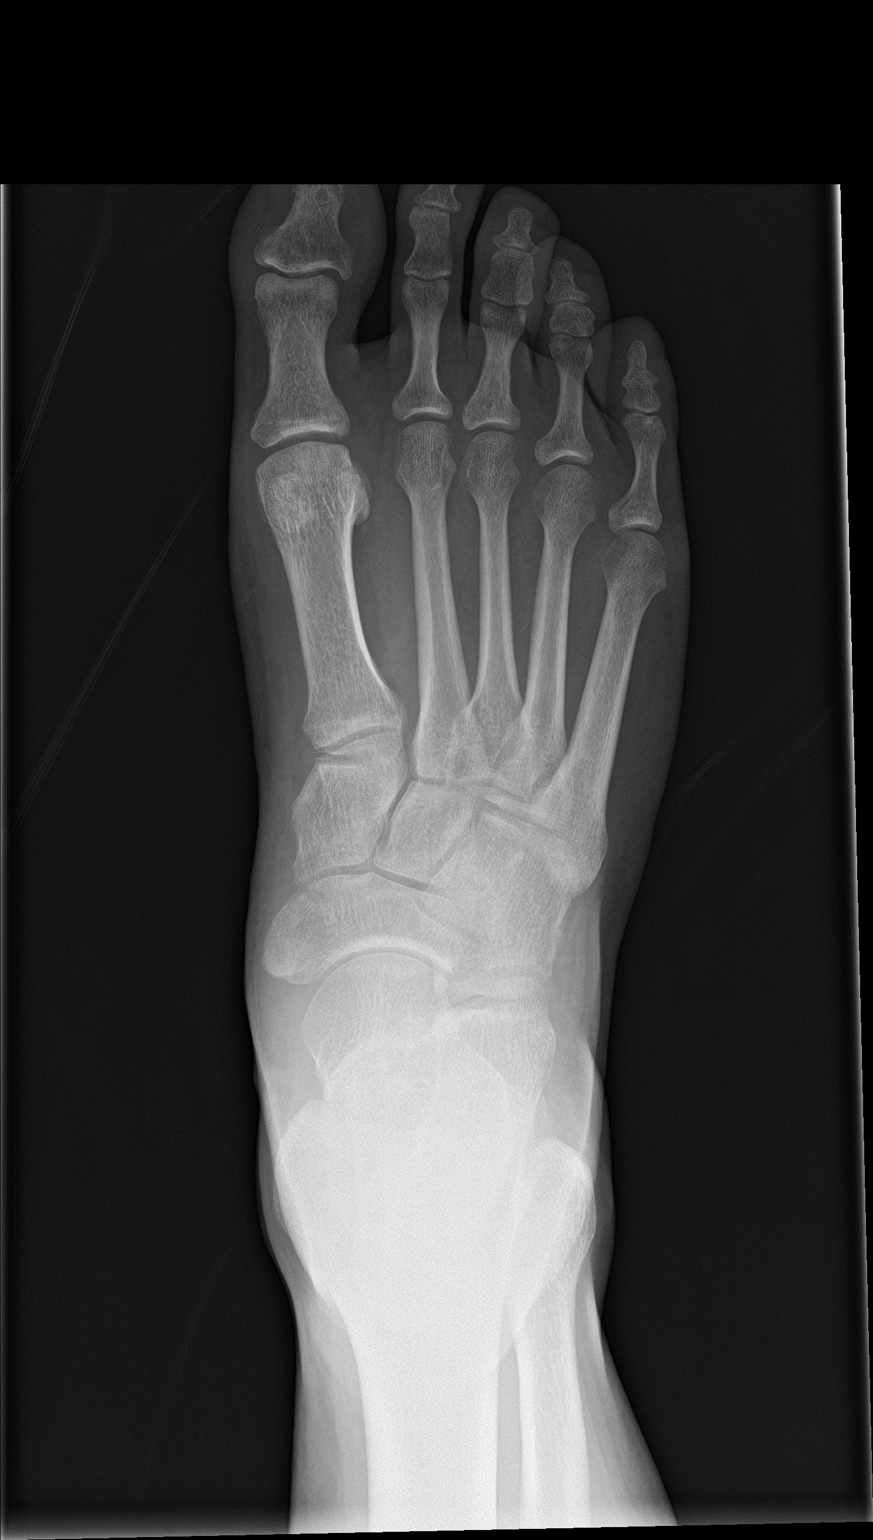

[foot obl]
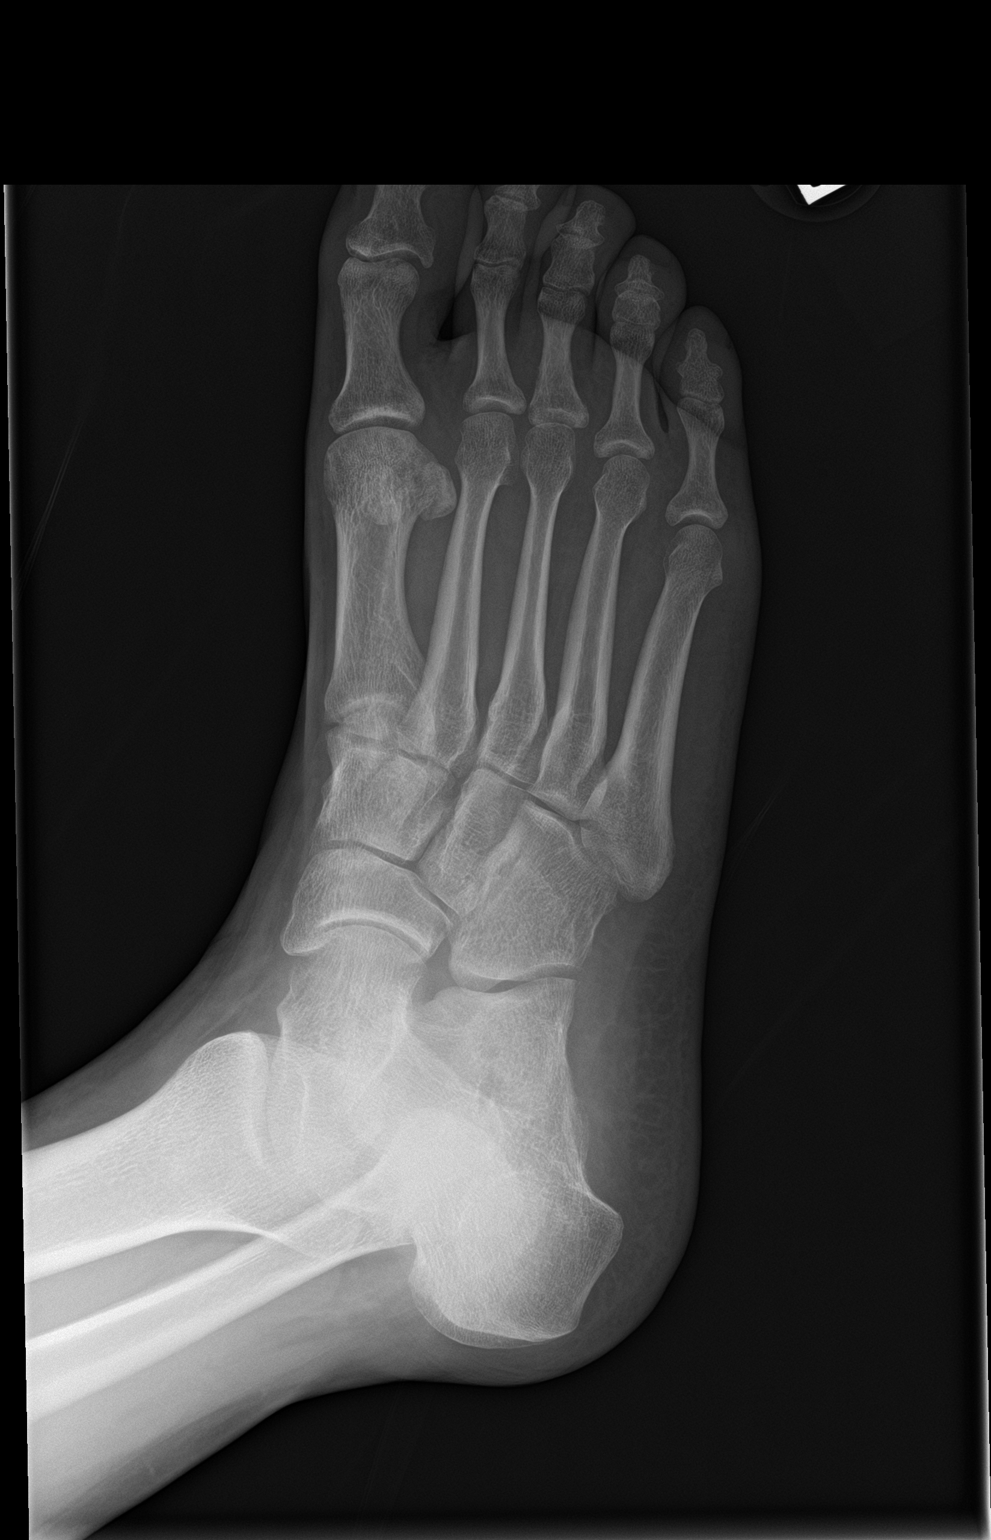

[foot lat]
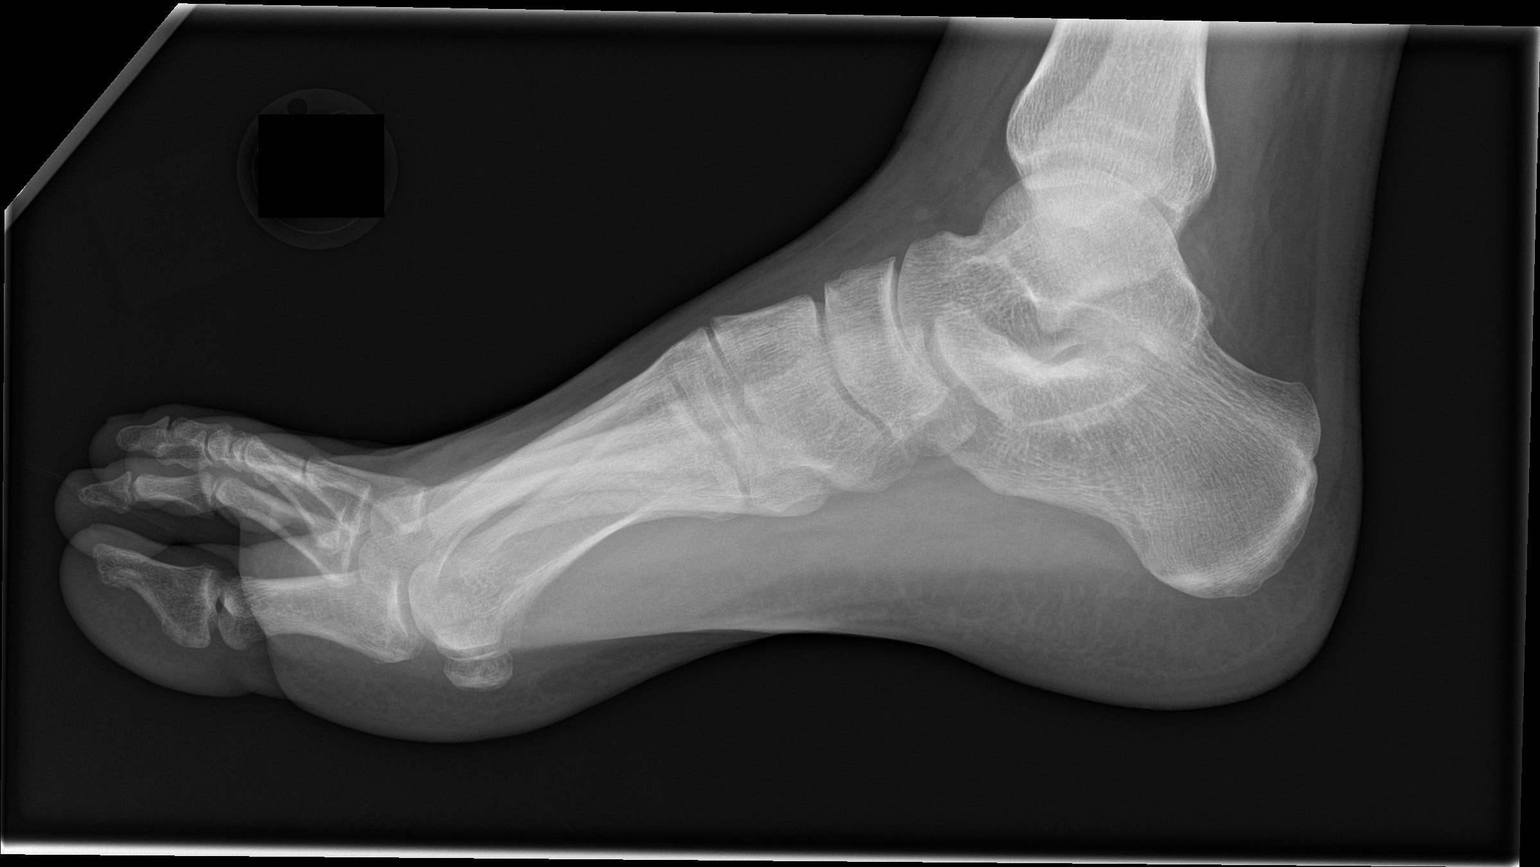

[3 of 3 positions shown; findings below may reference images not displayed]

FINDINGS: There is no evidence of fracture or dislocation. There is no
evidence of arthropathy or other focal bone abnormality. Soft
tissues are unremarkable.
IMPRESSION: No acute abnormality noted.

## 2020-06-18 ENCOUNTER — Telehealth: Payer: Self-pay | Admitting: Medical

## 2020-06-18 NOTE — Telephone Encounter (Signed)
On review pt was dismissed and [place] notified parent. I understand when dismissed from our office pt is dismissed across from all [place] offices? Marchelle Folks gave information for [facility] health

## 2020-07-02 ENCOUNTER — Other Ambulatory Visit: Payer: Self-pay | Admitting: Medical

## 2020-07-02 DIAGNOSIS — F3170 Bipolar disorder, currently in remission, most recent episode unspecified: Secondary | ICD-10-CM

## 2021-01-05 ENCOUNTER — Encounter (HOSPITAL_BASED_OUTPATIENT_CLINIC_OR_DEPARTMENT_OTHER): Payer: Self-pay | Admitting: Emergency Medicine

## 2021-01-05 ENCOUNTER — Emergency Department (HOSPITAL_BASED_OUTPATIENT_CLINIC_OR_DEPARTMENT_OTHER)
Admission: EM | Admit: 2021-01-05 | Discharge: 2021-01-05 | Disposition: A | Discharge status: Home / Self Care | Payer: No Typology Code available for payment source | Attending: Emergency Medicine | Admitting: Emergency Medicine

## 2021-01-05 ENCOUNTER — Other Ambulatory Visit: Payer: Self-pay

## 2021-01-05 DIAGNOSIS — X102XXA Contact with fats and cooking oils, initial encounter: Secondary | ICD-10-CM | POA: Insufficient documentation

## 2021-01-05 DIAGNOSIS — T23201A Burn of second degree of right hand, unspecified site, initial encounter: Secondary | ICD-10-CM | POA: Insufficient documentation

## 2021-01-05 DIAGNOSIS — T23001A Burn of unspecified degree of right hand, unspecified site, initial encounter: Secondary | ICD-10-CM | POA: Diagnosis present

## 2021-01-05 DIAGNOSIS — F17210 Nicotine dependence, cigarettes, uncomplicated: Secondary | ICD-10-CM | POA: Diagnosis not present

## 2021-01-05 DIAGNOSIS — T300 Burn of unspecified body region, unspecified degree: Secondary | ICD-10-CM

## 2021-01-05 MED ORDER — NAPROXEN 250 MG PO TABS
500.0000 mg | ORAL_TABLET | Freq: Once | ORAL | Status: AC
  Administered 2021-01-05: 500 mg via ORAL
  Filled 2021-01-05: qty 2
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
## 2021-03-14 IMAGING — DX DG FINGER INDEX 2+V*R*
3 series · 3 of 3 positions shown · non-contrast
Comparison: None.

CLINICAL DATA: Right index finger pain and swelling

EXAM:
RIGHT INDEX FINGER 2+V

[finger ap]
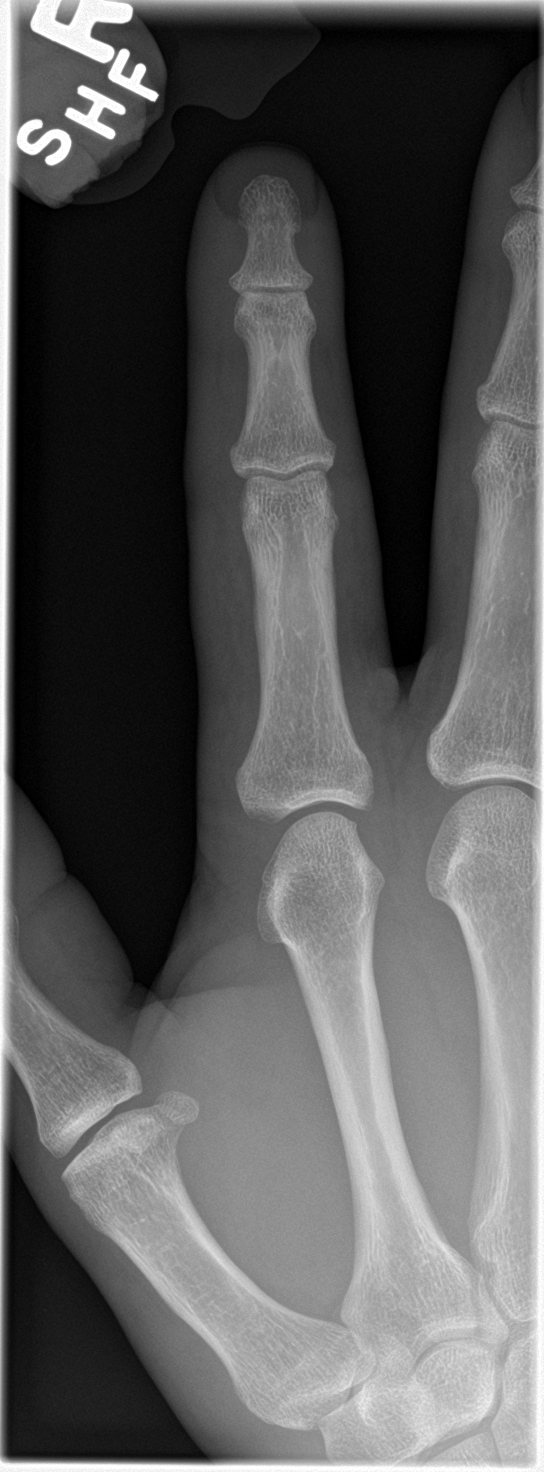

[finger obl]
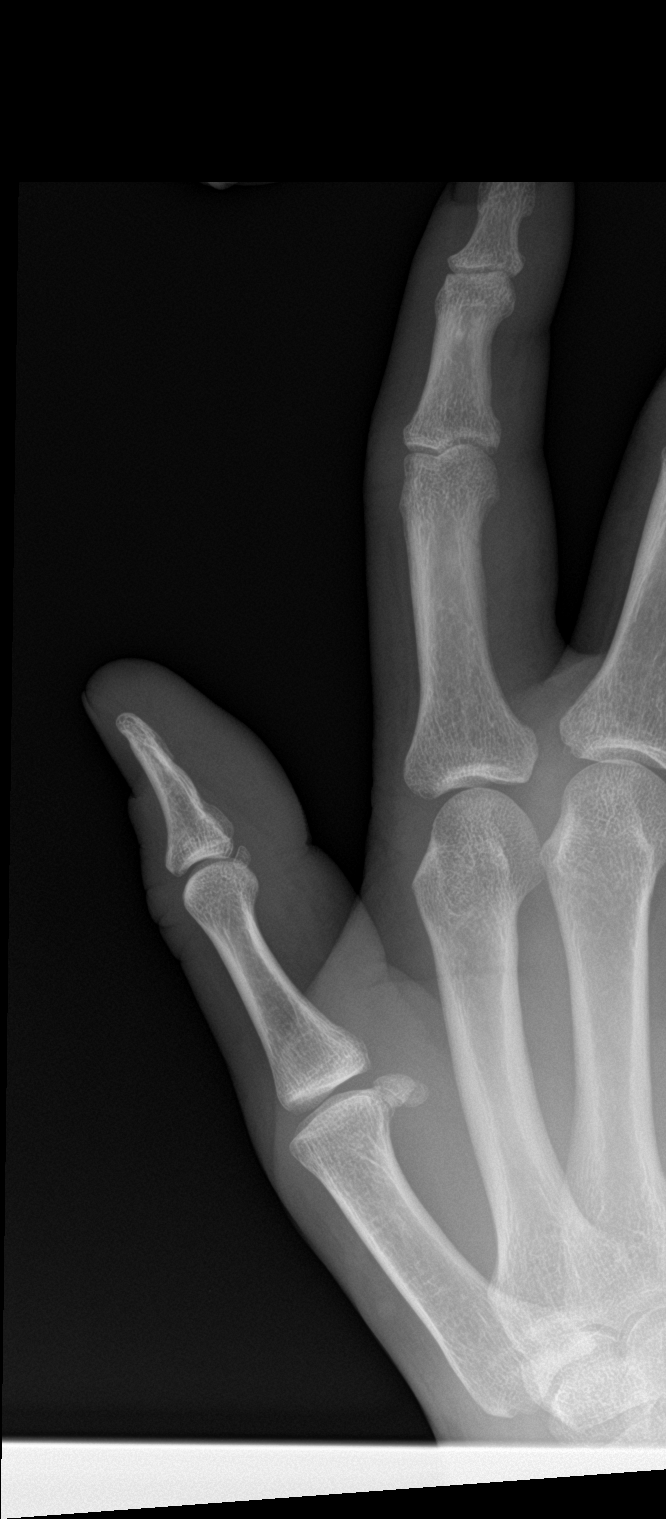

[finger lat]
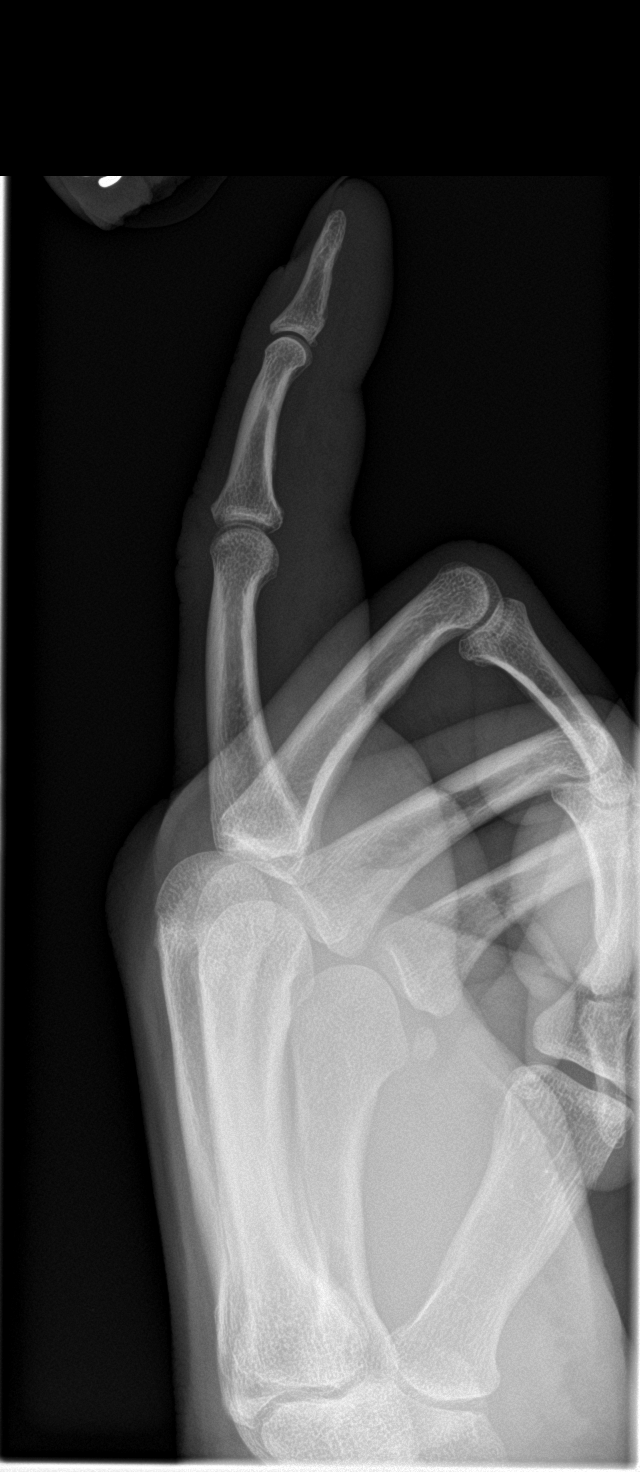

[3 of 3 positions shown; findings below may reference images not displayed]

FINDINGS: No fracture or dislocation of the right index finger. Joint spaces
are preserved. Soft tissue edema about the index digit.
IMPRESSION: 1. No fracture or dislocation of the right index finger. Joint
spaces are preserved.

2.  Soft tissue edema about the index digit.
